# Patient Record
Sex: Female | Born: 1975 | Race: White | Hispanic: No | Smoking: Current every day smoker
Health system: Southern US, Community
[De-identification: ages and names within clinical notes are randomized; demographics above are authoritative.]

---

## 1998-05-01 ENCOUNTER — Emergency Department (HOSPITAL_COMMUNITY): Admission: EM | Admit: 1998-05-01 | Discharge: 1998-05-01 | Payer: Self-pay | Admitting: Emergency Medicine

## 1998-06-25 ENCOUNTER — Emergency Department (HOSPITAL_COMMUNITY): Admission: EM | Admit: 1998-06-25 | Discharge: 1998-06-25 | Payer: Self-pay | Admitting: Emergency Medicine

## 1998-06-25 ENCOUNTER — Encounter: Payer: Self-pay | Admitting: Emergency Medicine

## 2000-10-30 ENCOUNTER — Emergency Department (HOSPITAL_COMMUNITY): Admission: EM | Admit: 2000-10-30 | Discharge: 2000-10-30 | Payer: Self-pay | Admitting: Emergency Medicine

## 2002-10-27 ENCOUNTER — Emergency Department (HOSPITAL_COMMUNITY): Admission: EM | Admit: 2002-10-27 | Discharge: 2002-10-27 | Payer: Self-pay | Admitting: Emergency Medicine

## 2002-10-27 ENCOUNTER — Encounter: Payer: Self-pay | Admitting: Emergency Medicine

## 2006-06-06 ENCOUNTER — Emergency Department (HOSPITAL_COMMUNITY): Admission: EM | Admit: 2006-06-06 | Discharge: 2006-06-06 | Payer: Self-pay | Admitting: Emergency Medicine
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
## 2013-11-10 ENCOUNTER — Other Ambulatory Visit: Payer: Self-pay | Admitting: Family Medicine

## 2013-11-10 DIAGNOSIS — N63 Unspecified lump in unspecified breast: Secondary | ICD-10-CM

## 2013-11-24 ENCOUNTER — Ambulatory Visit
Admission: RE | Admit: 2013-11-24 | Discharge: 2013-11-24 | Disposition: A | Discharge status: Home / Self Care | Payer: BC Managed Care – PPO | Source: Ambulatory Visit | Attending: Family Medicine | Admitting: Family Medicine

## 2013-11-24 DIAGNOSIS — N63 Unspecified lump in unspecified breast: Secondary | ICD-10-CM

## 2022-03-12 ENCOUNTER — Telehealth: Payer: Self-pay | Admitting: Family Medicine

## 2022-03-12 NOTE — Telephone Encounter (Signed)
Pt called to schedule a physical. Pt states that she wants bloodwork done to make sure she is healthy and she wants birth control to regulate her cycle. Pt states that she believes she has started going through menopause. After explaining to the patient what our family planning clinic does and doesn't do, the patient said she still wants birth control. Stasia Cavalier recommended that I send a phone note so a nurse can call the patient.

## 2022-03-13 NOTE — Telephone Encounter (Signed)
Pt wants to have labs drawn to verify if she is getting "enough nutrition."  Pt state that she is having problems "keeping food down and with diarrhea."  Pt notified to contact [facility] or the [facility].

## 2022-03-18 ENCOUNTER — Emergency Department (HOSPITAL_COMMUNITY): Payer: Self-pay

## 2022-03-18 ENCOUNTER — Inpatient Hospital Stay (HOSPITAL_COMMUNITY)
Admission: EM | Admit: 2022-03-18 | Discharge: 2022-03-19 | DRG: 641 | Discharge status: Against Medical Advice | Payer: Self-pay | Attending: Internal Medicine | Admitting: Internal Medicine

## 2022-03-18 ENCOUNTER — Other Ambulatory Visit: Payer: Self-pay

## 2022-03-18 DIAGNOSIS — Z5329 Procedure and treatment not carried out because of patient's decision for other reasons: Secondary | ICD-10-CM | POA: Diagnosis present

## 2022-03-18 DIAGNOSIS — R9431 Abnormal electrocardiogram [ECG] [EKG]: Secondary | ICD-10-CM | POA: Diagnosis present

## 2022-03-18 DIAGNOSIS — R112 Nausea with vomiting, unspecified: Secondary | ICD-10-CM | POA: Diagnosis present

## 2022-03-18 DIAGNOSIS — F1290 Cannabis use, unspecified, uncomplicated: Secondary | ICD-10-CM | POA: Diagnosis present

## 2022-03-18 DIAGNOSIS — I4510 Unspecified right bundle-branch block: Secondary | ICD-10-CM | POA: Diagnosis present

## 2022-03-18 DIAGNOSIS — K921 Melena: Secondary | ICD-10-CM | POA: Diagnosis present

## 2022-03-18 DIAGNOSIS — R109 Unspecified abdominal pain: Secondary | ICD-10-CM | POA: Diagnosis present

## 2022-03-18 DIAGNOSIS — R197 Diarrhea, unspecified: Secondary | ICD-10-CM | POA: Diagnosis present

## 2022-03-18 DIAGNOSIS — R42 Dizziness and giddiness: Secondary | ICD-10-CM | POA: Diagnosis present

## 2022-03-18 DIAGNOSIS — E876 Hypokalemia: Principal | ICD-10-CM | POA: Diagnosis present

## 2022-03-18 LAB — CBC
HCT: 32.7 % — ABNORMAL LOW (ref 36.0–46.0)
Hemoglobin: 12.2 g/dL (ref 12.0–15.0)
MCH: 43.7 pg — ABNORMAL HIGH (ref 26.0–34.0)
MCHC: 37.3 g/dL — ABNORMAL HIGH (ref 30.0–36.0)
MCV: 117.2 fL — ABNORMAL HIGH (ref 80.0–100.0)
Platelets: 144 10*3/uL — ABNORMAL LOW (ref 150–400)
RBC: 2.79 MIL/uL — ABNORMAL LOW (ref 3.87–5.11)
RDW: 13 % (ref 11.5–15.5)
WBC: 5.6 10*3/uL (ref 4.0–10.5)
nRBC: 0 % (ref 0.0–0.2)

## 2022-03-18 LAB — COMPREHENSIVE METABOLIC PANEL
ALT: 72 U/L — ABNORMAL HIGH (ref 0–44)
AST: 147 U/L — ABNORMAL HIGH (ref 15–41)
Albumin: 3.7 g/dL (ref 3.5–5.0)
Alkaline Phosphatase: 93 U/L (ref 38–126)
Anion gap: 13 (ref 5–15)
BUN: <5 mg/dL — ABNORMAL LOW (ref 6–20)
CO2: 30 mmol/L (ref 22–32)
Calcium: 8.7 mg/dL — ABNORMAL LOW (ref 8.9–10.3)
Chloride: 94 mmol/L — ABNORMAL LOW (ref 98–111)
Creatinine, Ser: 0.71 mg/dL (ref 0.44–1.00)
GFR, Estimated: >60 mL/min (ref >60)
Glucose, Bld: 123 mg/dL — ABNORMAL HIGH (ref 70–99)
Potassium: 2.5 mmol/L — CL (ref 3.5–5.1)
Sodium: 137 mmol/L (ref 135–145)
Total Bilirubin: 1.3 mg/dL — ABNORMAL HIGH (ref 0.3–1.2)
Total Protein: 6.4 g/dL — ABNORMAL LOW (ref 6.5–8.1)

## 2022-03-18 LAB — I-STAT CHEM 8, ED
BUN: <3 mg/dL — ABNORMAL LOW (ref 6–20)
Calcium, Ion: 1.01 mmol/L — ABNORMAL LOW (ref 1.15–1.40)
Chloride: 92 mmol/L — ABNORMAL LOW (ref 98–111)
Creatinine, Ser: 0.6 mg/dL (ref 0.44–1.00)
Glucose, Bld: 116 mg/dL — ABNORMAL HIGH (ref 70–99)
HCT: 38 % (ref 36.0–46.0)
Hemoglobin: 12.9 g/dL (ref 12.0–15.0)
Potassium: 2.4 mmol/L — CL (ref 3.5–5.1)
Sodium: 137 mmol/L (ref 135–145)
TCO2: 30 mmol/L (ref 22–32)

## 2022-03-18 LAB — URINALYSIS, ROUTINE W REFLEX MICROSCOPIC
Bilirubin Urine: NEGATIVE
Glucose, UA: NEGATIVE mg/dL
Ketones, ur: NEGATIVE mg/dL
Leukocytes,Ua: NEGATIVE
Nitrite: NEGATIVE
Protein, ur: 100 mg/dL — AB
Specific Gravity, Urine: 1.021 (ref 1.005–1.030)
pH: 5 (ref 5.0–8.0)

## 2022-03-18 LAB — I-STAT BETA HCG BLOOD, ED (MC, WL, AP ONLY): I-stat hCG, quantitative: <5 m[IU]/mL (ref <5)

## 2022-03-18 LAB — LIPASE, BLOOD: Lipase: 42 U/L (ref 11–51)

## 2022-03-18 LAB — MAGNESIUM: Magnesium: 1.3 mg/dL — ABNORMAL LOW (ref 1.7–2.4)

## 2022-03-18 MED ORDER — POTASSIUM CHLORIDE CRYS ER 20 MEQ PO TBCR
40.0000 meq | EXTENDED_RELEASE_TABLET | Freq: Once | ORAL | Status: AC
  Administered 2022-03-18: 40 meq via ORAL
  Filled 2022-03-18: qty 2

## 2022-03-18 MED ORDER — LACTATED RINGERS IV BOLUS
2000.0000 mL | Freq: Once | INTRAVENOUS | Status: AC
  Administered 2022-03-18: 2000 mL via INTRAVENOUS

## 2022-03-18 MED ORDER — IOHEXOL 350 MG/ML SOLN
100.0000 mL | Freq: Once | INTRAVENOUS | Status: AC | PRN
  Administered 2022-03-18: 100 mL via INTRAVENOUS

## 2022-03-18 MED ORDER — POTASSIUM CHLORIDE 10 MEQ/100ML IV SOLN
10.0000 meq | INTRAVENOUS | Status: AC
  Administered 2022-03-18 (×4): 10 meq via INTRAVENOUS
  Filled 2022-03-18 (×4): qty 100

## 2022-03-18 MED ORDER — MAGNESIUM SULFATE 2 GM/50ML IV SOLN
2.0000 g | Freq: Once | INTRAVENOUS | Status: AC
  Administered 2022-03-18: 2 g via INTRAVENOUS
  Filled 2022-03-18: qty 50

## 2022-03-18 MED ORDER — HYDROMORPHONE HCL 1 MG/ML IJ SOLN
1.0000 mg | Freq: Once | INTRAMUSCULAR | Status: AC
  Administered 2022-03-18: 1 mg via INTRAVENOUS
  Filled 2022-03-18: qty 1

## 2022-03-18 MED ORDER — HEPARIN SODIUM (PORCINE) 5000 UNIT/ML IJ SOLN: 5000.0000 [IU] | Freq: Three times a day (TID) | INTRAMUSCULAR | Status: DC

## 2022-03-18 MED ORDER — SODIUM CHLORIDE 0.9 % IV SOLN
INTRAVENOUS | Status: DC
Start: 2022-03-18 — End: 2022-03-19

## 2022-03-18 MED ORDER — ONDANSETRON HCL 4 MG/2ML IJ SOLN
4.0000 mg | Freq: Once | INTRAMUSCULAR | Status: AC
  Administered 2022-03-18: 4 mg via INTRAVENOUS
  Filled 2022-03-18: qty 2

## 2022-03-18 MED ORDER — HYDROMORPHONE HCL 1 MG/ML IJ SOLN: 0.5000 mg | INTRAMUSCULAR | Status: DC | PRN

## 2022-03-18 MED ORDER — ALBUTEROL SULFATE (2.5 MG/3ML) 0.083% IN NEBU: 2.5000 mg | INHALATION_SOLUTION | RESPIRATORY_TRACT | Status: DC | PRN

## 2022-03-18 MED ORDER — ONDANSETRON HCL 4 MG PO TABS: 4.0000 mg | ORAL_TABLET | Freq: Four times a day (QID) | ORAL | Status: DC | PRN

## 2022-03-18 MED ORDER — ONDANSETRON HCL 4 MG/2ML IJ SOLN: 4.0000 mg | Freq: Four times a day (QID) | INTRAMUSCULAR | Status: DC | PRN

## 2022-03-18 NOTE — H&P (Incomplete)
History and Physical    Holly Ingram BSJ:[id number] DOB: [birth year] DOA: 03/18/2022  PCP: Patient, No Pcp Per  Patient coming from:   I have personally briefly reviewed patient's old medical records in [facility] Link  Chief Complaint:  abdominal pain diarrhea  n/v    HPI: Holly Ingram is a 46 y.o. female with medical history significant of chronic diarrhea x 2 years with intermittent flare last severe flare 7/21 at which time patient was seen in ED and [facility]. AT that time patient was not admitted , work did not nonspecific chronic inflammatory changes. Patient states she . She not presents to ed with flare of symptoms over the last 2-3 days. Patient also noted over the last few days she has not being able to tolearate. Patient also endorse marijuana use and states that using marijuana helps her symptoms.   ED Course:  Patient on evaluation noted to have persistent n/v  Labs notable for hypokalemia at  2.5 with ekg noting u waves , patient also noted to have hypomagnesemia at 1.3.  Patient is admitted for treatment of refractory n/v and electrolyte abnormalities.   Vitals Afeb, bp 170/112, hr 93, rr 17  sat 99% on ra   Labs: Wbc 5.6, hgb 12.2, mcv 117.2 plt 144 Lipase 43,  NA 137, K 2.5, gly 123, ast 147, alt72, t bili 1.3 Ua-poor specimen repeat pending Tx dilaudid , zofran,lr 2L,potasium 10 meqx4 Mag 2gram x1  Review of Systems: As per HPI otherwise 10 point review of systems negative.   No past medical history on file.  *** The histories are not reviewed yet. Please review them in the "History" navigator section and refresh this SmartLink.   has no history on file for tobacco use, alcohol use, and drug use.  Allergies  Allergen Reactions  . Morphine     No family history on file. *** Prior to Admission medications   Not on File    Physical Exam: Vitals:   03/18/22 1815 03/18/22 1830 03/18/22 1935 03/18/22 2030  BP: (!) 117/96 (!) 147/100  (!) 158/90  Pulse:  65   85  Resp: 18 (!) 22  14  Temp:   98.4 F (36.9 C)   TempSrc:   Oral   SpO2:  98%  100%    Constitutional: NAD, calm, comfortable Vitals:   03/18/22 1815 03/18/22 1830 03/18/22 1935 03/18/22 2030  BP: (!) 117/96 (!) 147/100  (!) 158/90  Pulse:  65  85  Resp: 18 (!) 22  14  Temp:   98.4 F (36.9 C)   TempSrc:   Oral   SpO2:  98%  100%   Constitutional: NAD, calm, comfortable Eyes: PERRL, lids and conjunctivae normal ENMT: Mucous membranes are moist. Posterior pharynx clear of any exudate or lesions.Normal dentition.  Neck: normal, supple, no masses, no thyromegaly Respiratory: clear to auscultation bilaterally, no wheezing, no crackles. Normal respiratory effort. No accessory muscle use.  Cardiovascular: Regular rate and rhythm, no murmurs / rubs / gallops. No extremity edema. 2+ pedal pulses. No carotid bruits.  Abdomen: no tenderness, no masses palpated. No hepatosplenomegaly. Bowel sounds positive.  Musculoskeletal: no clubbing / cyanosis. No joint deformity upper and lower extremities. Good ROM, no contractures. Normal muscle tone.  Skin: no rashes, lesions, ulcers. No induration Neurologic: CN 2-12 grossly intact. Sensation intact, DTR normal. Strength 5/5 in all 4.  Psychiatric: Normal judgment and insight. Alert and oriented x 3. Normal mood.    Labs on Admission: I have personally  reviewed following labs and imaging studies  CBC: Recent Labs  Lab 03/18/22 1519 03/18/22 1552  WBC 5.6  --   HGB 12.2 12.9  HCT 32.7* 38.0  MCV 117.2*  --   PLT 144*  --    Basic Metabolic Panel: Recent Labs  Lab 03/18/22 1519 03/18/22 1552  NA 137 137  K 2.5* 2.4*  CL 94* 92*  CO2 30  --   GLUCOSE 123* 116*  BUN <5* <3*  CREATININE 0.71 0.60  CALCIUM 8.7*  --   MG 1.3*  --    GFR: CrCl cannot be calculated (Unknown ideal weight.). Liver Function Tests: Recent Labs  Lab 03/18/22 1519  AST 147*  ALT 72*  ALKPHOS 93  BILITOT 1.3*  PROT 6.4*  ALBUMIN 3.7   Recent  Labs  Lab 03/18/22 1519  LIPASE 42   No results for input(s): "AMMONIA" in the last 168 hours. Coagulation Profile: No results for input(s): "INR", "PROTIME" in the last 168 hours. Cardiac Enzymes: No results for input(s): "CKTOTAL", "CKMB", "CKMBINDEX", "TROPONINI" in the last 168 hours. BNP (last 3 results) No results for input(s): "PROBNP" in the last 8760 hours. HbA1C: No results for input(s): "HGBA1C" in the last 72 hours. CBG: No results for input(s): "GLUCAP" in the last 168 hours. Lipid Profile: No results for input(s): "CHOL", "HDL", "LDLCALC", "TRIG", "CHOLHDL", "LDLDIRECT" in the last 72 hours. Thyroid Function Tests: No results for input(s): "TSH", "T4TOTAL", "FREET4", "T3FREE", "THYROIDAB" in the last 72 hours. Anemia Panel: No results for input(s): "VITAMINB12", "FOLATE", "FERRITIN", "TIBC", "IRON", "RETICCTPCT" in the last 72 hours. Urine analysis:    Component Value Date/Time   COLORURINE AMBER (A) 03/18/2022 1521   APPEARANCEUR HAZY (A) 03/18/2022 1521   LABSPEC 1.021 03/18/2022 1521   PHURINE 5.0 03/18/2022 1521   GLUCOSEU NEGATIVE 03/18/2022 1521   HGBUR MODERATE (A) 03/18/2022 1521   BILIRUBINUR NEGATIVE 03/18/2022 1521   KETONESUR NEGATIVE 03/18/2022 1521   PROTEINUR 100 (A) 03/18/2022 1521   NITRITE NEGATIVE 03/18/2022 1521   LEUKOCYTESUR NEGATIVE 03/18/2022 1521    Radiological Exams on Admission: CT ABDOMEN PELVIS W CONTRAST  Result Date: 03/18/2022 CLINICAL DATA:  Abdominal pain. EXAM: CT ABDOMEN AND PELVIS WITH CONTRAST TECHNIQUE: Multidetector CT imaging of the abdomen and pelvis was performed using the standard protocol following bolus administration of intravenous contrast. RADIATION DOSE REDUCTION: This exam was performed according to the departmental dose-optimization program which includes automated exposure control, adjustment of the mA and/or kV according to patient size and/or use of iterative reconstruction technique. CONTRAST:  165mL  OMNIPAQUE IOHEXOL 350 MG/ML SOLN COMPARISON:  None Available. FINDINGS: Lower chest: No acute abnormality. Hepatobiliary: Hepatic steatosis.  Normal gallbladder. Pancreas: Unremarkable. No pancreatic ductal dilatation or surrounding inflammatory changes. Spleen: Normal in size without focal abnormality. Adrenals/Urinary Tract: Adrenal glands are unremarkable. Kidneys are normal, without renal calculi, focal lesion, or hydronephrosis. Bladder is unremarkable. Stomach/Bowel: Stomach is within normal limits. Appendix appears normal. No evidence of bowel wall thickening, distention, or inflammatory changes. Vascular/Lymphatic: No significant vascular findings are present. No enlarged abdominal or pelvic lymph nodes. Reproductive: Normal appearance of the uterus. Small partially calcified mass abuts the uterine fundus, measuring 1.3 cm. This may represent an exophytic fibroid. 4.1 cm right adnexal cyst. Other: No abdominal wall hernia or abnormality. No abdominopelvic ascites. Musculoskeletal: Reverse S shaped scoliosis of the thoracolumbar spine with associated osteoarthritic changes. IMPRESSION: 1. Hepatic steatosis. 2. 4.1 cm right adnexal cyst. No follow-up imaging recommended. Note: This recommendation does not apply to  premenarchal patients and to those with increased risk (genetic, family history, elevated tumor markers or other high-risk factors) of ovarian cancer. Reference: JACR 2020 Feb; 17(2):248-254 3. Reverse S shaped scoliosis of the thoracolumbar spine with associated osteoarthritic changes. Electronically Signed   By: Ted Mcalpine M.D.   On: 03/18/2022 20:15    EKG: Independently reviewed. ***  Assessment/Plan Active Problems:   * No active hospital problems. *   Refractory n/v with abdominal pain  Insetting of chronic diarrhea x 2 years  As well as marijuana use  - supportive care  -to be complete sent stool studies  - will gi consult for c-scope r/o microscopic colitis  -check crp   -encourage cessation of marijuana   Hypomagnesemia  Hypokalemia -replete  -start oral once patient able to tolerate po   Abn lfts  -ast 147, alt 72    Mild thrombocytopenia   DVT prophylaxis   Code Status: *** (Full/Partial (specify details) Family Communication: *** (Specify name, relationship. Do not write "discussed with patient". Specify tel # if discussed over the phone) Disposition Plan: *** (specify when and where you expect patient to be discharged) Consults called: *** (with names) Admission status: *** (inpatient / obs / tele / medical floor / SDU)   Lurline Del MD [facility] Pager [phone]  If 7PM-7AM, please contact night-coverage [email or web address] Password [email or web address]  03/18/2022, 11:02 PM

## 2022-03-18 NOTE — ED Triage Notes (Signed)
Pt here from home for abd pain w/ N/V that pt states has been a recurrent problem for years. Pt reports being seen at [facility] and only being told her colon was inflamed. Pt reports she cannot eat without either throwing up or having diarrhea right after. Pt reports she is malnourished and feels like she is dying.

## 2022-03-18 NOTE — ED Provider Notes (Signed)
[facility] EMERGENCY DEPARTMENT Provider Note   CSN: [id number] Arrival date & time: 03/18/22  1422     History  Chief Complaint  Patient presents with   Emesis   Abdominal Pain   Diarrhea    Holly Ingram is a 46 y.o. female.  46 year old female with no significant past medical history presents today for evaluation of abdominal pain, nausea, vomiting, and diarrhea.  She states this has been ongoing now for about 2-1/2 years.  Initially this happened about twice a week but over the past year or so it has become daily.  She reports about 10-15 bowel movements which are loose and watery per day.  Reports occasional blood in her stool.  Reports worsening in nausea and vomiting as of late and not been able to keep any food or drink down.  States last p.o. intake she was able to keep them was yesterday.  Denies hematemesis.  States the abdominal pain has been consistent throughout her disease process however recently got worse.  Previously seen at [facility] but states has never been evaluated by gastroenterologist.  She endorses lightheadedness, but denies syncopal episodes.  She states couple months ago she became depressed because no one was listening to her regarding her illness and states for the past 2 weeks she has been taking her friend's antidepressant.  Currently denies thoughts of hurting herself.  On further questioning patient does endorse marijuana use almost daily.  States she started this following onset of her symptoms and she found that it helps.  Last use yesterday.  The history is provided by the patient. No language interpreter was used.       Home Medications Prior to Admission medications   Not on File      Allergies    Morphine    Review of Systems   Review of Systems  Constitutional:  Positive for fatigue. Negative for chills and fever.  Respiratory:  Negative for shortness of breath.   Cardiovascular:  Negative for chest pain.  Gastrointestinal:   Positive for abdominal pain, blood in stool, diarrhea, nausea and vomiting.  Neurological:  Positive for weakness and light-headedness. Negative for syncope.  All other systems reviewed and are negative.   Physical Exam Updated Vital Signs BP (!) 170/112 (BP Location: Right Arm)   Pulse 93   Temp 98.9 F (37.2 C) (Oral)   Resp 17   SpO2 99%  Physical Exam Vitals and nursing note reviewed.  Constitutional:      General: She is not in acute distress.    Appearance: Normal appearance. She is not ill-appearing.  HENT:     Head: Normocephalic and atraumatic.     Nose: Nose normal.  Eyes:     General: No scleral icterus.    Extraocular Movements: Extraocular movements intact.     Conjunctiva/sclera: Conjunctivae normal.  Cardiovascular:     Rate and Rhythm: Normal rate and regular rhythm.     Pulses: Normal pulses.     Heart sounds: Normal heart sounds.  Pulmonary:     Effort: Pulmonary effort is normal. No respiratory distress.     Breath sounds: Normal breath sounds. No wheezing or rales.  Abdominal:     General: There is no distension.     Tenderness: There is no abdominal tenderness.  Musculoskeletal:        General: Normal range of motion.     Cervical back: Normal range of motion.  Skin:    General: Skin is warm and  dry.  Neurological:     General: No focal deficit present.     Mental Status: She is alert. Mental status is at baseline.     ED Results / Procedures / Treatments   Labs (all labs ordered are listed, but only abnormal results are displayed) Labs Reviewed  CBC - Abnormal; Notable for the following components:      Result Value   RBC 2.79 (*)    HCT 32.7 (*)    MCV 117.2 (*)    MCH 43.7 (*)    MCHC 37.3 (*)    Platelets 144 (*)    All other components within normal limits  URINALYSIS, ROUTINE W REFLEX MICROSCOPIC - Abnormal; Notable for the following components:   Color, Urine AMBER (*)    APPearance HAZY (*)    Hgb urine dipstick MODERATE (*)     Protein, ur 100 (*)    Bacteria, UA RARE (*)    All other components within normal limits  I-STAT CHEM 8, ED - Abnormal; Notable for the following components:   Potassium 2.4 (*)    Chloride 92 (*)    BUN <3 (*)    Glucose, Bld 116 (*)    Calcium, Ion 1.01 (*)    All other components within normal limits  LIPASE, BLOOD  COMPREHENSIVE METABOLIC PANEL  I-STAT BETA HCG BLOOD, ED ([facility], [facility], [facility] ONLY)    EKG None  Radiology No results found.  Procedures .Critical Care  Performed by: Marita Kansas, PA-C Authorized by: Marita Kansas, PA-C   Critical care provider statement:    Critical care time (minutes):  30   Critical care was necessary to treat or prevent imminent or life-threatening deterioration of the following conditions:  Metabolic crisis   Critical care was time spent personally by me on the following activities:  Development of treatment plan with patient or surrogate, discussions with consultants, evaluation of patient's response to treatment, examination of patient, ordering and review of laboratory studies, ordering and review of radiographic studies, ordering and performing treatments and interventions, pulse oximetry, re-evaluation of patient's condition and review of old charts     Medications Ordered in ED Medications  potassium chloride 10 mEq in 100 mL IVPB (has no administration in time range)  lactated ringers bolus 2,000 mL (has no administration in time range)  HYDROmorphone (DILAUDID) injection 1 mg (has no administration in time range)  ondansetron (ZOFRAN) injection 4 mg (has no administration in time range)  potassium chloride SA (KLOR-CON M) CR tablet 40 mEq (has no administration in time range)    ED Course/ Medical Decision Making/ A&P Clinical Course as of 03/18/22 2045  Sun Mar 18, 2022  2045 CT abdomen pelvis without acute intra-abdominal process to explain patient's symptoms. [AA]    Clinical Course User Index [AA] Marita Kansas, PA-C                            Medical Decision Making Amount and/or Complexity of Data Reviewed Labs: ordered. Radiology: ordered.  Risk Prescription drug management. Decision regarding hospitalization.  Medical Decision Making / ED Course   This patient presents to the ED for concern of abdominal pain, nausea, vomiting, diarrhea, this involves an extensive number of treatment options, and is a complaint that carries with it a high risk of complications and morbidity.  The differential diagnosis includes gastroenteritis, diverticulitis, pancreatitis  MDM: 46 year old female with no significant past medical history presents today for above-mentioned  symptoms.  These have been longstanding however recently worsened.  Reports difficulty tolerating any p.o. intake as of late.  CBC shows no leukocytosis or anemia.  I-STAT Chem-8 showed hypokalemia with potassium 2.4.  CMP still pending.  IV potassium repletion as well as p.o. potassium repletion ordered.  EKG does show prolonged QT, as well as U waves.  CMP shows potassium of 2.5.  Renal function preserved.  Magnesium of 1.3.  2 g magnesium ordered.  CT abdomen pelvis pending.  Patient will likely require admission for cardiac monitoring and electrolyte repletion. CT abdomen pelvis without acute intra-abdominal findings.  Will discuss with hospitalist for admission. Discussed with hospitalist will evaluate patient for admission.   Lab Tests: -I ordered, reviewed, and interpreted labs.   The pertinent results include:   Labs Reviewed  COMPREHENSIVE METABOLIC PANEL - Abnormal; Notable for the following components:      Result Value   Potassium 2.5 (*)    Chloride 94 (*)    Glucose, Bld 123 (*)    BUN <5 (*)    Calcium 8.7 (*)    Total Protein 6.4 (*)    AST 147 (*)    ALT 72 (*)    Total Bilirubin 1.3 (*)    All other components within normal limits  CBC - Abnormal; Notable for the following components:   RBC 2.79 (*)    HCT 32.7 (*)    MCV 117.2 (*)     MCH 43.7 (*)    MCHC 37.3 (*)    Platelets 144 (*)    All other components within normal limits  URINALYSIS, ROUTINE W REFLEX MICROSCOPIC - Abnormal; Notable for the following components:   Color, Urine AMBER (*)    APPearance HAZY (*)    Hgb urine dipstick MODERATE (*)    Protein, ur 100 (*)    Bacteria, UA RARE (*)    All other components within normal limits  I-STAT CHEM 8, ED - Abnormal; Notable for the following components:   Potassium 2.4 (*)    Chloride 92 (*)    BUN <3 (*)    Glucose, Bld 116 (*)    Calcium, Ion 1.01 (*)    All other components within normal limits  GASTROINTESTINAL PANEL BY PCR, STOOL (REPLACES STOOL CULTURE)  LIPASE, BLOOD  MAGNESIUM  I-STAT BETA HCG BLOOD, ED ([facility], [facility], [facility] ONLY)      EKG  EKG Interpretation  Date/Time:  Sunday March 18 2022 17:05:25 EDT Ventricular Rate:  80 PR Interval:  123 QRS Duration: 137 QT Interval:  466 QTC Calculation: 489 R Axis:   52 Text Interpretation: Sinus rhythm Ventricular premature complex Right bundle branch block QTc 489 prolonged Confirmed by Vivien Rossetti ([id number]) on 03/18/2022 5:59:16 PM         Imaging Studies ordered: I ordered imaging studies including CT abdomen pelvis with contrast I independently visualized and interpreted imaging. I agree with the radiologist interpretation   Medicines ordered and prescription drug management: Meds ordered this encounter  Medications   potassium chloride 10 mEq in 100 mL IVPB   lactated ringers bolus 2,000 mL   HYDROmorphone (DILAUDID) injection 1 mg   ondansetron (ZOFRAN) injection 4 mg   potassium chloride SA (KLOR-CON M) CR tablet 40 mEq   magnesium sulfate IVPB 2 g 50 mL    -I have reviewed the patients home medicines and have made adjustments as needed  Critical interventions IV repletion of potassium for hypokalemia, IV fluids  Cardiac Monitoring: The  patient was maintained on a cardiac monitor.  I personally viewed and interpreted the  cardiac monitored which showed an underlying rhythm of: Normal sinus rhythm  Reevaluation: After the interventions noted above, I reevaluated the patient and found that they have :improved  Co morbidities that complicate the patient evaluation No past medical history on file.    Dispostion: Discussed with hospitalist will evaluate patient for admission.  Final Clinical Impression(s) / ED Diagnoses Final diagnoses:  None    Rx / DC Orders ED Discharge Orders     None         Marita Kansas, PA-C 03/18/22 2116    Mardene Sayer, MD 03/18/22 2148

## 2022-03-18 NOTE — ED Notes (Signed)
Main lab called to add on the [facility]

## 2022-03-19 LAB — COMPREHENSIVE METABOLIC PANEL
ALT: 77 U/L — ABNORMAL HIGH (ref 0–44)
AST: 165 U/L — ABNORMAL HIGH (ref 15–41)
Albumin: 3.5 g/dL (ref 3.5–5.0)
Alkaline Phosphatase: 92 U/L (ref 38–126)
Anion gap: 14 (ref 5–15)
BUN: <5 mg/dL — ABNORMAL LOW (ref 6–20)
CO2: 27 mmol/L (ref 22–32)
Calcium: 8.5 mg/dL — ABNORMAL LOW (ref 8.9–10.3)
Chloride: 93 mmol/L — ABNORMAL LOW (ref 98–111)
Creatinine, Ser: 0.96 mg/dL (ref 0.44–1.00)
GFR, Estimated: >60 mL/min (ref >60)
Glucose, Bld: 121 mg/dL — ABNORMAL HIGH (ref 70–99)
Potassium: 3 mmol/L — ABNORMAL LOW (ref 3.5–5.1)
Sodium: 134 mmol/L — ABNORMAL LOW (ref 135–145)
Total Bilirubin: 1.4 mg/dL — ABNORMAL HIGH (ref 0.3–1.2)
Total Protein: 6 g/dL — ABNORMAL LOW (ref 6.5–8.1)

## 2022-03-19 LAB — CBC
HCT: 30 % (ref 36.0–46.0)
Hemoglobin: 11 g/dL (ref 12.0–15.0)
MCH: 43.8 pg — ABNORMAL HIGH (ref 26.0–34.0)
MCHC: 36.7 g/dL — ABNORMAL HIGH (ref 30.0–36.0)
MCV: 119.5 fL — ABNORMAL HIGH (ref 80.0–100.0)
Platelets: 68 10*3/uL — ABNORMAL LOW (ref 150–400)
RBC: 2.51 MIL/uL (ref 3.87–5.11)
RDW: 58.3 % — ABNORMAL HIGH (ref 11.5–15.5)
WBC: 6.2 10*3/uL (ref 4.0–10.5)

## 2022-03-19 LAB — C-REACTIVE PROTEIN: CRP: 0.5 mg/dL (ref <1.0)

## 2022-03-19 LAB — HIV ANTIBODY (ROUTINE TESTING W REFLEX): HIV Screen 4th Generation wRfx: NONREACTIVE

## 2022-03-19 LAB — ETHANOL: Alcohol, Ethyl (B): <10 mg/dL (ref <10)

## 2022-03-19 MED ORDER — ALUM & MAG HYDROXIDE-SIMETH 200-200-20 MG/5ML PO SUSP
30.0000 mL | ORAL | Status: DC | PRN
  Administered 2022-03-19: 30 mL via ORAL
  Filled 2022-03-19: qty 30

## 2022-03-19 NOTE — ED Notes (Signed)
Pt came back in for belongings, where IV was removed and pt informed staff she was leaving. Pt informed of risks of leaving. Pt stable upon leaving the ED

## 2022-03-19 NOTE — ED Notes (Signed)
Patient left ama prior to being seen

## 2022-03-19 NOTE — ED Notes (Signed)
This RN informed pt that they aren't allowed to leave ED due to being safety issue and that if they left they would have to start process all over again. Both pt and visitor understood. This RN came back to pt not being able to be found. This pt asked registration if they seen pt leave, registration informed this RN that pt was seen leaving ED.

## 2022-03-20 ENCOUNTER — Emergency Department (HOSPITAL_COMMUNITY)
Admission: EM | Admit: 2022-03-20 | Discharge: 2022-03-21 | Discharge status: Against Medical Advice | Payer: Medicaid Other | Attending: Emergency Medicine | Admitting: Emergency Medicine

## 2022-03-20 DIAGNOSIS — R079 Chest pain, unspecified: Secondary | ICD-10-CM | POA: Insufficient documentation

## 2022-03-20 DIAGNOSIS — R112 Nausea with vomiting, unspecified: Secondary | ICD-10-CM | POA: Insufficient documentation

## 2022-03-20 DIAGNOSIS — Z5321 Procedure and treatment not carried out due to patient leaving prior to being seen by health care provider: Secondary | ICD-10-CM | POA: Insufficient documentation

## 2022-03-20 DIAGNOSIS — R109 Unspecified abdominal pain: Secondary | ICD-10-CM | POA: Insufficient documentation

## 2022-03-20 DIAGNOSIS — F17210 Nicotine dependence, cigarettes, uncomplicated: Secondary | ICD-10-CM | POA: Insufficient documentation

## 2022-03-20 LAB — TROPONIN I (HIGH SENSITIVITY)
Troponin I (High Sensitivity): 7 ng/L (ref <18)
Troponin I (High Sensitivity): 4 ng/L (ref <18)

## 2022-03-20 LAB — COMPREHENSIVE METABOLIC PANEL
ALT: 128 U/L — ABNORMAL HIGH (ref 0–44)
AST: 319 U/L — ABNORMAL HIGH (ref 15–41)
Albumin: 3.5 g/dL (ref 3.5–5.0)
Alkaline Phosphatase: 108 U/L (ref 38–126)
Anion gap: 9 (ref 5–15)
BUN: <5 mg/dL — ABNORMAL LOW (ref 6–20)
CO2: 33 mmol/L — ABNORMAL HIGH (ref 22–32)
Calcium: 8.7 mg/dL — ABNORMAL LOW (ref 8.9–10.3)
Chloride: 98 mmol/L (ref 98–111)
Creatinine, Ser: 0.77 mg/dL (ref 0.44–1.00)
GFR, Estimated: >60 mL/min (ref >60)
Glucose, Bld: 106 mg/dL — ABNORMAL HIGH (ref 70–99)
Potassium: 3.2 mmol/L — ABNORMAL LOW (ref 3.5–5.1)
Sodium: 140 mmol/L (ref 135–145)
Total Bilirubin: 1.4 mg/dL — ABNORMAL HIGH (ref 0.3–1.2)
Total Protein: 6.1 g/dL — ABNORMAL LOW (ref 6.5–8.1)

## 2022-03-20 LAB — CBC
HCT: 31.3 % — ABNORMAL LOW (ref 36.0–46.0)
Hemoglobin: 11.1 g/dL — ABNORMAL LOW (ref 12.0–15.0)
MCH: 43 pg — ABNORMAL HIGH (ref 26.0–34.0)
MCHC: 35.5 g/dL (ref 30.0–36.0)
MCV: 121.3 fL — ABNORMAL HIGH (ref 80.0–100.0)
Platelets: 149 10*3/uL — ABNORMAL LOW (ref 150–400)
RBC: 2.58 MIL/uL — ABNORMAL LOW (ref 3.87–5.11)
RDW: 13.4 % (ref 11.5–15.5)
WBC: 5.1 10*3/uL (ref 4.0–10.5)
nRBC: 0.4 % — ABNORMAL HIGH (ref 0.0–0.2)

## 2022-03-20 LAB — URINALYSIS, ROUTINE W REFLEX MICROSCOPIC
Glucose, UA: NEGATIVE mg/dL
Hgb urine dipstick: NEGATIVE
Ketones, ur: 5 mg/dL — AB
Nitrite: NEGATIVE
Protein, ur: 100 mg/dL — AB
Specific Gravity, Urine: 1.028 (ref 1.005–1.030)
pH: 6 (ref 5.0–8.0)

## 2022-03-20 LAB — LIPASE, BLOOD: Lipase: 44 U/L (ref 11–51)

## 2022-03-20 NOTE — ED Provider Triage Note (Signed)
Emergency Medicine Provider Triage Evaluation Note  Holly Ingram , a 46 y.o. female  was evaluated in triage.  Pt complains of ongoing abdominal pain, chest pain, nausea, vomiting since she left AMA from the hospital yesterday.  Patient was evaluated by hospitalist and was pending admission for cardiac monitoring and electrolyte repletion.  Patient states that she left to smoke a cigarette.  She is very apologetic and states that she will not do that again.  She is still feeling poorly and wants to "pick up where she left off".  Denies fever, chills.  Review of Systems  Positive:  Negative:   Physical Exam  BP (!) 154/89   Pulse 88   Temp 98.8 F (37.1 C) (Oral)   Resp 16   SpO2 99%  Gen:   Awake, no distress   Resp:  Normal effort  MSK:   Moves extremities without difficulty  Other:    Medical Decision Making  Medically screening exam initiated at 4:08 PM.  Appropriate orders placed.  Holly Ingram was informed that the remainder of the evaluation will be completed by another provider, this initial triage assessment does not replace that evaluation, and the importance of remaining in the ED until their evaluation is complete.     Janell Quiet, [place] 03/20/22 1610

## 2022-03-20 NOTE — ED Triage Notes (Signed)
Pt c/o abd pain N/V/D, poor PO, electrolyte imbalance, seen for same 8/6, left prior to care completion. Told she has inflamed colon, states she wants to stay for full tx "this time so I can feel better."

## 2022-03-21 NOTE — ED Notes (Signed)
Patient states the wait is too long and she is leaving 

## 2022-05-20 ENCOUNTER — Emergency Department (HOSPITAL_BASED_OUTPATIENT_CLINIC_OR_DEPARTMENT_OTHER): Payer: Self-pay

## 2022-05-20 ENCOUNTER — Encounter (HOSPITAL_COMMUNITY): Payer: Self-pay

## 2022-05-20 ENCOUNTER — Inpatient Hospital Stay (HOSPITAL_BASED_OUTPATIENT_CLINIC_OR_DEPARTMENT_OTHER)
Admission: EM | Admit: 2022-05-20 | Discharge: 2022-05-22 | DRG: 603 | Disposition: A | Discharge status: Home / Self Care | Payer: Self-pay | Attending: Internal Medicine | Admitting: Internal Medicine

## 2022-05-20 ENCOUNTER — Encounter (HOSPITAL_COMMUNITY): Payer: Self-pay | Admitting: Internal Medicine

## 2022-05-20 ENCOUNTER — Other Ambulatory Visit: Payer: Self-pay

## 2022-05-20 DIAGNOSIS — D539 Nutritional anemia, unspecified: Secondary | ICD-10-CM

## 2022-05-20 DIAGNOSIS — R7989 Other specified abnormal findings of blood chemistry: Secondary | ICD-10-CM | POA: Diagnosis present

## 2022-05-20 DIAGNOSIS — L03114 Cellulitis of left upper limb: Principal | ICD-10-CM | POA: Diagnosis present

## 2022-05-20 DIAGNOSIS — R197 Diarrhea, unspecified: Secondary | ICD-10-CM | POA: Diagnosis present

## 2022-05-20 DIAGNOSIS — Z23 Encounter for immunization: Secondary | ICD-10-CM

## 2022-05-20 DIAGNOSIS — R17 Unspecified jaundice: Secondary | ICD-10-CM | POA: Diagnosis present

## 2022-05-20 DIAGNOSIS — L0390 Cellulitis, unspecified: Secondary | ICD-10-CM | POA: Diagnosis present

## 2022-05-20 DIAGNOSIS — M419 Scoliosis, unspecified: Secondary | ICD-10-CM | POA: Diagnosis present

## 2022-05-20 DIAGNOSIS — E876 Hypokalemia: Secondary | ICD-10-CM | POA: Diagnosis present

## 2022-05-20 DIAGNOSIS — E872 Acidosis, unspecified: Secondary | ICD-10-CM | POA: Diagnosis present

## 2022-05-20 DIAGNOSIS — W5501XA Bitten by cat, initial encounter: Secondary | ICD-10-CM

## 2022-05-20 DIAGNOSIS — R7401 Elevation of levels of liver transaminase levels: Secondary | ICD-10-CM | POA: Diagnosis present

## 2022-05-20 DIAGNOSIS — S61452A Open bite of left hand, initial encounter: Secondary | ICD-10-CM | POA: Diagnosis present

## 2022-05-20 DIAGNOSIS — D519 Vitamin B12 deficiency anemia, unspecified: Secondary | ICD-10-CM | POA: Diagnosis present

## 2022-05-20 LAB — URINALYSIS, ROUTINE W REFLEX MICROSCOPIC
Glucose, UA: NEGATIVE mg/dL
Nitrite: NEGATIVE
Protein, ur: 100 mg/dL — AB
Specific Gravity, Urine: 1.025 (ref 1.005–1.030)
pH: 7.5 (ref 5.0–8.0)

## 2022-05-20 LAB — COMPREHENSIVE METABOLIC PANEL
ALT: 29 U/L (ref 0–44)
AST: 54 U/L — ABNORMAL HIGH (ref 15–41)
Albumin: 3.5 g/dL (ref 3.5–5.0)
Alkaline Phosphatase: 96 U/L (ref 38–126)
Anion gap: 13 (ref 5–15)
BUN: 6 mg/dL (ref 6–20)
CO2: 30 mmol/L (ref 22–32)
Calcium: 8.8 mg/dL — ABNORMAL LOW (ref 8.9–10.3)
Chloride: 99 mmol/L (ref 98–111)
Creatinine, Ser: 0.81 mg/dL (ref 0.44–1.00)
GFR, Estimated: >60 mL/min (ref >60)
Glucose, Bld: 131 mg/dL — ABNORMAL HIGH (ref 70–99)
Potassium: 2.9 mmol/L — ABNORMAL LOW (ref 3.5–5.1)
Sodium: 142 mmol/L (ref 135–145)
Total Bilirubin: 4.6 mg/dL — ABNORMAL HIGH (ref 0.3–1.2)
Total Protein: 6.4 g/dL — ABNORMAL LOW (ref 6.5–8.1)

## 2022-05-20 LAB — LACTIC ACID, PLASMA
Lactic Acid, Venous: 3.4 mmol/L (ref 0.5–1.9)
Lactic Acid, Venous: 1.6 mmol/L (ref 0.5–1.9)

## 2022-05-20 LAB — CBC WITH DIFFERENTIAL/PLATELET
Abs Immature Granulocytes: 0.08 10*3/uL — ABNORMAL HIGH (ref 0.00–0.07)
Basophils Absolute: 0 10*3/uL (ref 0.0–0.1)
Basophils Relative: 0 %
Eosinophils Absolute: 0 10*3/uL (ref 0.0–0.5)
Eosinophils Relative: 0 %
HCT: 27.9 % — ABNORMAL LOW (ref 36.0–46.0)
Hemoglobin: 10.1 g/dL — ABNORMAL LOW (ref 12.0–15.0)
Immature Granulocytes: 1 %
Lymphocytes Relative: 5 %
Lymphs Abs: 0.7 10*3/uL (ref 0.7–4.0)
MCH: 42.8 pg — ABNORMAL HIGH (ref 26.0–34.0)
MCHC: 36.2 g/dL — ABNORMAL HIGH (ref 30.0–36.0)
MCV: 118.2 fL — ABNORMAL HIGH (ref 80.0–100.0)
Monocytes Absolute: 0.5 10*3/uL (ref 0.1–1.0)
Monocytes Relative: 4 %
Neutro Abs: 13.2 10*3/uL — ABNORMAL HIGH (ref 1.7–7.7)
Neutrophils Relative %: 90 %
Platelets: 163 10*3/uL (ref 150–400)
RBC: 2.36 MIL/uL — ABNORMAL LOW (ref 3.87–5.11)
RDW: 17.8 % — ABNORMAL HIGH (ref 11.5–15.5)
WBC: 14.5 10*3/uL — ABNORMAL HIGH (ref 4.0–10.5)
nRBC: 0 % (ref 0.0–0.2)

## 2022-05-20 LAB — PREGNANCY, URINE: Preg Test, Ur: NEGATIVE

## 2022-05-20 MED ORDER — ONDANSETRON HCL 4 MG/2ML IJ SOLN
4.0000 mg | Freq: Once | INTRAMUSCULAR | Status: AC
  Administered 2022-05-20: 4 mg via INTRAVENOUS
  Filled 2022-05-20: qty 2

## 2022-05-20 MED ORDER — POTASSIUM CHLORIDE 10 MEQ/100ML IV SOLN
10.0000 meq | INTRAVENOUS | Status: AC
  Administered 2022-05-20 (×2): 10 meq via INTRAVENOUS
  Filled 2022-05-20 (×2): qty 100

## 2022-05-20 MED ORDER — ENOXAPARIN SODIUM 40 MG/0.4ML IJ SOSY
40.0000 mg | PREFILLED_SYRINGE | INTRAMUSCULAR | Status: DC
  Administered 2022-05-20 – 2022-05-21 (×2): 40 mg via SUBCUTANEOUS
  Filled 2022-05-20 (×2): qty 0.4

## 2022-05-20 MED ORDER — CALCIUM CARBONATE 1250 (500 CA) MG PO TABS
1.0000 | ORAL_TABLET | Freq: Once | ORAL | Status: AC
  Administered 2022-05-20: 1250 mg via ORAL
  Filled 2022-05-20: qty 1

## 2022-05-20 MED ORDER — ONDANSETRON HCL 4 MG/2ML IJ SOLN: 4.0000 mg | Freq: Four times a day (QID) | INTRAMUSCULAR | Status: DC | PRN

## 2022-05-20 MED ORDER — NICOTINE 14 MG/24HR TD PT24
14.0000 mg | MEDICATED_PATCH | Freq: Every day | TRANSDERMAL | Status: AC
  Administered 2022-05-20 – 2022-05-21 (×2): 14 mg via TRANSDERMAL
  Filled 2022-05-20 (×2): qty 1

## 2022-05-20 MED ORDER — VANCOMYCIN HCL IN DEXTROSE 1-5 GM/200ML-% IV SOLN
1000.0000 mg | Freq: Once | INTRAVENOUS | Status: AC
  Administered 2022-05-20: 1000 mg via INTRAVENOUS
  Filled 2022-05-20: qty 200

## 2022-05-20 MED ORDER — FENTANYL CITRATE PF 50 MCG/ML IJ SOSY
12.5000 ug | PREFILLED_SYRINGE | INTRAMUSCULAR | Status: DC | PRN
  Administered 2022-05-20: 12.5 ug via INTRAVENOUS
  Filled 2022-05-20: qty 1

## 2022-05-20 MED ORDER — HYDROCODONE-ACETAMINOPHEN 5-325 MG PO TABS
1.0000 | ORAL_TABLET | ORAL | Status: DC | PRN
  Administered 2022-05-20: 1 via ORAL
  Filled 2022-05-20: qty 1

## 2022-05-20 MED ORDER — SODIUM CHLORIDE 0.9 % IV BOLUS
1000.0000 mL | Freq: Once | INTRAVENOUS | Status: AC
  Administered 2022-05-20: 1000 mL via INTRAVENOUS

## 2022-05-20 MED ORDER — FENTANYL CITRATE PF 50 MCG/ML IJ SOSY
50.0000 ug | PREFILLED_SYRINGE | Freq: Once | INTRAMUSCULAR | Status: AC
  Administered 2022-05-20: 50 ug via INTRAVENOUS
  Filled 2022-05-20: qty 1

## 2022-05-20 MED ORDER — SODIUM CHLORIDE 0.9 % IV SOLN: INTRAVENOUS | Status: AC

## 2022-05-20 MED ORDER — TETANUS-DIPHTH-ACELL PERTUSSIS 5-2.5-18.5 LF-MCG/0.5 IM SUSY
0.5000 mL | PREFILLED_SYRINGE | Freq: Once | INTRAMUSCULAR | Status: AC
  Administered 2022-05-20: 0.5 mL via INTRAMUSCULAR
  Filled 2022-05-20: qty 0.5

## 2022-05-20 MED ORDER — SODIUM CHLORIDE 0.9 % IV SOLN
3.0000 g | Freq: Four times a day (QID) | INTRAVENOUS | Status: DC
  Administered 2022-05-20 – 2022-05-22 (×7): 3 g via INTRAVENOUS
  Filled 2022-05-20 (×8): qty 8

## 2022-05-20 MED ORDER — HYDROCODONE-ACETAMINOPHEN 5-325 MG PO TABS
1.0000 | ORAL_TABLET | ORAL | Status: DC | PRN
  Administered 2022-05-21 (×2): 2 via ORAL
  Administered 2022-05-21: 1 via ORAL
  Administered 2022-05-21 – 2022-05-22 (×5): 2 via ORAL
  Filled 2022-05-20 (×5): qty 2
  Filled 2022-05-20: qty 1
  Filled 2022-05-20 (×2): qty 2

## 2022-05-20 NOTE — Assessment & Plan Note (Signed)
Macrocytic anemia. -Patient has premenopausal symptoms.  Has not had a menstrual cycle for at least a month and a half.  However she has been dealing with GI issues for least 2 years with persistent diarrhea which is worsened recently.  Suspect she could have issues with malnutrition. -Obtain vitamin B12, folate and iron panel -Total bili is also elevated at 4.6 up from 1.4.  Have low suspicion that it is hemolytic but will obtain LDH and haptoglobin.

## 2022-05-20 NOTE — Assessment & Plan Note (Signed)
K 2.9. Replete with IV K 38meq x2

## 2022-05-20 NOTE — Assessment & Plan Note (Addendum)
Secondary to cat bite on around dorsal 4th MCP of left hand -Given IV vancomycin in ED. Will change to IV Unasyn  -pt still has good ROM, no fluctuance or crepitus on exam.  EDP discussed with hand surgery Dr. Marcelino Scot and no urgent intervention or wash out recommended at this point -Tetanus vaccine given

## 2022-05-20 NOTE — Progress Notes (Signed)
Pharmacy Antibiotic Note  Holly Ingram is a 46 y.o. female admitted on 05/20/2022 with cat bite wound infection.  Pharmacy has been consulted for unasyn dosing.  Plan: Unasyn 3 mg IV q6h   Height: 5\' 2"  (157.5 cm) Weight: 56.2 kg (124 lb) IBW/kg (Calculated) : 50.1  Temp (24hrs), Avg:99.3 F (37.4 C), Min:99.2 F (37.3 C), Max:99.3 F (37.4 C)  Recent Labs  Lab 05/20/22 1421 05/20/22 1839  WBC 14.5*  --   CREATININE 0.81  --   LATICACIDVEN 3.4* 1.6    Estimated Creatinine Clearance: 68.6 mL/min (by C-G formula based on SCr of 0.81 mg/dL).    Allergies  Allergen Reactions   Morphine     Antimicrobials this admission: 10/8 vanc x 1 10/8 Unasyn>>  Dose adjustments this admission:  Microbiology results: 10/8 BCx2: sent  Thank you for allowing pharmacy to be a part of this patient's care.  Eudelia Bunch, Pharm.D 05/20/2022 7:35 PM

## 2022-05-20 NOTE — ED Triage Notes (Signed)
Patient arrives with complaints of sustaining a cat bite 2 days. Per the patient, the cat bit hit her left hand. The patient cleaned the site, but her hand has become swollen/red/hot to touch.  The cat was a house cat that was up to do date on her shots. Patient has also developed nausea/vomiting over the past 2 days as well.   Rates pain a 10/10.

## 2022-05-20 NOTE — Assessment & Plan Note (Signed)
Patient has history of chronically elevated LFTs and elevated total bilirubin.  This was acutely worse today at 4.6 with no significant GI symptoms. -She has been worked up in the past at [facility] in 2021 with negative hepatitis B, C, HIV, TSH,IgG, CRP, ESR -Has GI appt scheduled outpatient -LDH and haptoglobin labs pending due to concomitant anemia

## 2022-05-20 NOTE — Assessment & Plan Note (Signed)
Mild.  Oral calcium supplementation

## 2022-05-20 NOTE — ED Notes (Signed)
Report called to floor

## 2022-05-20 NOTE — ED Provider Notes (Signed)
Emergency Department Provider Note   I have reviewed the triage vital signs and the nursing notes.   HISTORY  Chief Complaint Animal Bite and Emesis   HPI Holly Ingram is a 46 y.o. female presents the emergency department for evaluation of pain, redness, swelling to the left hand spreading up the left arm after cat bite.  The bite occurred 2 days prior to ED arrival.  She is been having fevers, aches, vomiting, severe fatigue and shaking.  She has noticed fairly rapid swelling and redness up the left arm.  She was bitten by a friend's cat, who is up-to-date on rabies.  The cat bit her single time on the dorsum of the left hand just adjacent to the fourth/fifth MCP.  She denies any other injuries.  No past medical history on file.  Review of Systems  Constitutional: Positive fever/chills Cardiovascular: Denies chest pain. Respiratory: Denies shortness of breath. Gastrointestinal: No abdominal pain.   Musculoskeletal: Negative for back pain. Skin: Left hand swelling/redness to the arm.  Neurological: Negative for headaches, focal weakness or numbness.  ____________________________________________   PHYSICAL EXAM:  VITAL SIGNS: ED Triage Vitals  Enc Vitals Group     BP 05/20/22 1412 135/81     Pulse Rate 05/20/22 1412 85     Resp 05/20/22 1412 18     Temp 05/20/22 1412 99.3 F (37.4 C)     Temp Source 05/20/22 1412 Oral     SpO2 05/20/22 1412 97 %     Weight 05/20/22 1410 124 lb (56.2 kg)     Height 05/20/22 1410 5\' 2"  (1.575 m)   Constitutional: Alert and oriented. Well appearing and in no acute distress. Eyes: Conjunctivae are normal.  Head: Atraumatic. Nose: No congestion/rhinnorhea. Mouth/Throat: Mucous membranes are moist.   Neck: No stridor.  Cardiovascular: Normal rate, regular rhythm. Good peripheral circulation. Grossly normal heart sounds.   Respiratory: Normal respiratory effort.  No retractions. Lungs CTAB. Gastrointestinal: Soft and nontender. No  distention.  Musculoskeletal: No lower extremity tenderness nor edema. No gross deformities of extremities. Neurologic:  Normal speech and language. No gross focal neurologic deficits are appreciated.  Skin:  Skin is warm and dry.  Erythema to the dorsum of the left hand with 2 puncture marks at the base of the fourth/fifth MCP joints on the left.  There is diffuse swelling of the left hand and distal left forearm.  Some erythema tracking medially to the upper arm.  No area of fluctuance or skin breakdown/ulceration.       ____________________________________________   LABS (all labs ordered are listed, but only abnormal results are displayed)  Labs Reviewed  LACTIC ACID, PLASMA - Abnormal; Notable for the following components:      Result Value   Lactic Acid, Venous 3.4 (*)    All other components within normal limits  COMPREHENSIVE METABOLIC PANEL - Abnormal; Notable for the following components:   Potassium 2.9 (*)    Glucose, Bld 131 (*)    Calcium 8.8 (*)    Total Protein 6.4 (*)    AST 54 (*)    Total Bilirubin 4.6 (*)    All other components within normal limits  CBC WITH DIFFERENTIAL/PLATELET - Abnormal; Notable for the following components:   WBC 14.5 (*)    RBC 2.36 (*)    Hemoglobin 10.1 (*)    HCT 27.9 (*)    MCV 118.2 (*)    MCH 42.8 (*)    MCHC 36.2 (*)  RDW 17.8 (*)    Neutro Abs 13.2 (*)    Abs Immature Granulocytes 0.08 (*)    All other components within normal limits  URINALYSIS, ROUTINE W REFLEX MICROSCOPIC - Abnormal; Notable for the following components:   Color, Urine ORANGE (*)    APPearance HAZY (*)    Hgb urine dipstick TRACE (*)    Bilirubin Urine SMALL (*)    Ketones, ur TRACE (*)    Protein, ur 100 (*)    Leukocytes,Ua TRACE (*)    Bacteria, UA RARE (*)    All other components within normal limits  CULTURE, BLOOD (ROUTINE X 2)  CULTURE, BLOOD (ROUTINE X 2)  PREGNANCY, URINE  LACTIC ACID, PLASMA    ____________________________________________  RADIOLOGY  DG Hand Complete Left  Result Date: 05/20/2022 CLINICAL DATA:  Cat bite EXAM: LEFT HAND - COMPLETE 3 VIEW COMPARISON:  None Available. FINDINGS: There is no evidence of fracture or dislocation. There is no evidence of arthropathy or other focal bone abnormality. Moderate palmar soft tissue swelling. No radiopaque foreign body. IMPRESSION: No acute fracture or radiopaque foreign body. Electronically Signed   By: Jacob Moores M.D.   On: 05/20/2022 15:38    ____________________________________________   PROCEDURES  Procedure(s) performed:   Procedures  CRITICAL CARE Performed by: Maia Plan Total critical care time: 35 minutes Critical care time was exclusive of separately billable procedures and treating other patients. Critical care was necessary to treat or prevent imminent or life-threatening deterioration. Critical care was time spent personally by me on the following activities: development of treatment plan with patient and/or surrogate as well as nursing, discussions with consultants, evaluation of patient's response to treatment, examination of patient, obtaining history from patient or surrogate, ordering and performing treatments and interventions, ordering and review of laboratory studies, ordering and review of radiographic studies, pulse oximetry and re-evaluation of patient's condition.  Alona Bene, MD Emergency Medicine  ____________________________________________   INITIAL IMPRESSION / ASSESSMENT AND PLAN / ED COURSE  Pertinent labs & imaging results that were available during my care of the patient were reviewed by me and considered in my medical decision making (see chart for details).   This patient is Presenting for Evaluation of hand pain/swelling, which does require a range of treatment options, and is a complaint that involves a high risk of morbidity and mortality.  The Differential  Diagnoses include cellulitis, developing abscess, fasciitis, flexor tenosynovitis, etc.   I did obtain Additional Historical Information from friend at bedside.   Clinical Laboratory Tests Ordered, included lactic acid of 3.4 with white count of 14.5.  Mild anemia to 10.1.  No acute kidney injury.  Potassium of 2.9.  Radiologic Tests Ordered, included hand x-ray. I independently interpreted the images and agree with radiology interpretation.   Cardiac Monitor Tracing which shows NSR.   Consult complete with Hand Surgery on call, Dr. Carola Frost. Plan for [facility] admit to [facility] for abx and monitoring. Hand surgery is available at that location shoulder symptoms worsen but no indication for emergent/urgent washout at this time.   Hospitalist with plan for admit.   Medical Decision Making: Summary:  Presents to the emergency department for evaluation of left arm cellulitis after cat bite.  I do not appreciate any clear superficial abscess at the site of the bite.  Plan to coordinate with hand surgery but anticipate admit for IV antibiotics and monitoring.   Reevaluation with update and discussion with patient. Plan for admit to [facility]. She is in agreement.  Disposition: admit  ____________________________________________  FINAL CLINICAL IMPRESSION(S) / ED DIAGNOSES  Final diagnoses:  Cellulitis of hand, left  Cat bite, initial encounter  Lactic acidosis     Note:  This document was prepared using Dragon voice recognition software and may include unintentional dictation errors.  Alona Bene, MD, [facility] Emergency Medicine    Aniya Jolicoeur, Arlyss Repress, MD 05/20/22 [phone]

## 2022-05-20 NOTE — H&P (Signed)
History and Physical    Patient: Holly Ingram LOV:[id number] DOB: 11-08-1975 DOA: 05/20/2022 DOS: the patient was seen and examined on 05/20/2022 PCP: Patient, No Pcp Per  Patient coming from:  [place] ED  Chief Complaint:  Chief Complaint  Patient presents with   Animal Bite   Emesis   HPI: Yoshino Broccoli is a 46 y.o. female with medical history significant of scoliosis who presents with cat bite and worsening erythema edema.  Patient was playing with a friend's cat 2 days ago and was bit around her left dorsal hand around fourth MCP.  Cat up-to-date with vaccination.  Initially had swelling up to her wrist.  The following day woke up with worsening tightness of her edema and increased difficulty in flexing her fingers and holding a fist.  In the next few hours redness progressively spread all the way to her upper forearm.  Had increasing pain.  She also had nausea but no vomiting.  In the ED, she was afebrile, normotensive on room air.   WBC of 14.5, hemoglobin of 10.1 with MCV of 118.  Lactate of 3.4.  Potassium of 2.9.  Creatinine 0.81.  Mildly elevated AST 54, ALT 20, total bilirubin 4.6 which has upward trended from 1.4 from just 2 months ago.   Review of Systems: As mentioned in the history of present illness. All other systems reviewed and are negative.  Social History: Has tobacco, marijuana and occasional alcohol use. Allergies  Allergen Reactions   Morphine     History reviewed. No pertinent family history.  Prior to Admission medications   Not on File    Physical Exam: Vitals:   05/20/22 1412 05/20/22 1413 05/20/22 1727 05/20/22 1821  BP: 135/81  (!) 120/97 (!) 149/93  Pulse: 85  86 85  Resp: 18  18 18   Temp: 99.3 F (37.4 C)  99.2 F (37.3 C) 99.3 F (37.4 C)  TempSrc: Oral  Oral Oral  SpO2: 97% 97% 100% 99%  Weight:      Height:       Constitutional: NAD, calm, comfortable, nontoxic appearing middle-age female lying in bed Eyes:  lids and  conjunctivae normal ENMT: Mucous membranes are moist. Neck: normal, supple Respiratory: clear to auscultation bilaterally, no wheezing, no crackles. Normal respiratory effort. No accessory muscle use.  Cardiovascular: Regular rate and rhythm, no murmurs / rubs / gallops. No extremity edema.  Abdomen: Soft, nondistended no tenderness,Bowel sounds positive.  Musculoskeletal: no clubbing / cyanosis. No joint deformity upper and lower extremities.  Edema and erythema of the left dorsal hand radiating up to distal left forearm just above the elbow.  Small healed bite wound noted just cephalad to between the dorsal fourth and fifth MCP. skin is taut up to the forearm due to edema.  Pain with mild palpation.  No crepitus or fluctuant mass palpated.  Patient is still able to flex her fingers although with pain.     Skin: see MSK exam Neurologic: CN 2-12 grossly intact. Strength 5/5 in all 4.  Psychiatric: Normal judgment and insight. Alert and oriented x 3. Normal mood. Data Reviewed:  See HPI  Assessment and Plan: * Cellulitis Secondary to cat bite on around dorsal 4th MCP of left hand -Given IV vancomycin in ED. Will change to IV Unasyn  -pt still has good ROM, no fluctuance or crepitus on exam.  EDP discussed with hand surgery Dr. Marcelino Scot and no urgent intervention or wash out recommended at this point -Tetanus vaccine given  Hyperbilirubinemia Patient  has history of chronically elevated LFTs and elevated total bilirubin.  This was acutely worse today at 4.6 with no significant GI symptoms. -She has been worked up in the past at [facility] in 2021 with negative hepatitis B, C, HIV, TSH,IgG, CRP, ESR -Has GI appt scheduled outpatient -LDH and haptoglobin labs pending due to concomitant anemia  Macrocytic anemia Macrocytic anemia. -Patient has premenopausal symptoms.  Has not had a menstrual cycle for at least a month and a half.  However she has been dealing with GI issues for least 2 years with  persistent diarrhea which is worsened recently.  Suspect she could have issues with malnutrition. -Obtain vitamin B12, folate and iron panel -Total bili is also elevated at 4.6 up from 1.4.  Have low suspicion that it is hemolytic but will obtain LDH and haptoglobin.  Hypocalcemia Mild.  Oral calcium supplementation  Hypokalemia K 2.9. Replete with IV K 65mq x2      Advance Care Planning:   Code Status: Full Code   Consults: EDP discussed with hand surgeon Dr. HMarcelino Scot  Family Communication: Discussed with mother at bedside  Severity of Illness: The appropriate patient status for this patient is INPATIENT. Inpatient status is judged to be reasonable and necessary in order to provide the required intensity of service to ensure the patient's safety. The patient's presenting symptoms, physical exam findings, and initial radiographic and laboratory data in the context of their chronic comorbidities is felt to place them at high risk for further clinical deterioration. Furthermore, it is not anticipated that the patient will be medically stable for discharge from the hospital within 2 midnights of admission.   * I certify that at the point of admission it is my clinical judgment that the patient will require inpatient hospital care spanning beyond 2 midnights from the point of admission due to high intensity of service, high risk for further deterioration and high frequency of surveillance required.*  Author: COrene Desanctis DO 05/20/2022 8:30 PM  For on call review [email or web address]

## 2022-05-21 LAB — CBC
HCT: 24.4 % — ABNORMAL LOW (ref 36.0–46.0)
Hemoglobin: 8.4 g/dL — ABNORMAL LOW (ref 12.0–15.0)
MCH: 42.4 pg — ABNORMAL HIGH (ref 26.0–34.0)
MCHC: 34.4 g/dL (ref 30.0–36.0)
MCV: 123.2 fL — ABNORMAL HIGH (ref 80.0–100.0)
Platelets: 128 10*3/uL — ABNORMAL LOW (ref 150–400)
RBC: 1.98 MIL/uL — ABNORMAL LOW (ref 3.87–5.11)
RDW: 17.8 % — ABNORMAL HIGH (ref 11.5–15.5)
WBC: 6.6 10*3/uL (ref 4.0–10.5)
nRBC: 0 % (ref 0.0–0.2)

## 2022-05-21 LAB — COMPREHENSIVE METABOLIC PANEL
ALT: 28 U/L (ref 0–44)
AST: 48 U/L — ABNORMAL HIGH (ref 15–41)
Albumin: 2.7 g/dL — ABNORMAL LOW (ref 3.5–5.0)
Alkaline Phosphatase: 73 U/L (ref 38–126)
Anion gap: 8 (ref 5–15)
BUN: 5 mg/dL — ABNORMAL LOW (ref 6–20)
CO2: 26 mmol/L (ref 22–32)
Calcium: 7.3 mg/dL — ABNORMAL LOW (ref 8.9–10.3)
Chloride: 106 mmol/L (ref 98–111)
Creatinine, Ser: 0.59 mg/dL (ref 0.44–1.00)
GFR, Estimated: >60 mL/min (ref >60)
Glucose, Bld: 76 mg/dL (ref 70–99)
Potassium: 2.4 mmol/L — CL (ref 3.5–5.1)
Sodium: 140 mmol/L (ref 135–145)
Total Bilirubin: 2.8 mg/dL — ABNORMAL HIGH (ref 0.3–1.2)
Total Protein: 5.2 g/dL — ABNORMAL LOW (ref 6.5–8.1)

## 2022-05-21 LAB — FOLATE: Folate: 3.8 ng/mL — ABNORMAL LOW (ref >5.9)

## 2022-05-21 LAB — BASIC METABOLIC PANEL
Anion gap: 6 (ref 5–15)
BUN: 5 mg/dL — ABNORMAL LOW (ref 6–20)
CO2: 25 mmol/L (ref 22–32)
Calcium: 7.6 mg/dL — ABNORMAL LOW (ref 8.9–10.3)
Chloride: 106 mmol/L (ref 98–111)
Creatinine, Ser: 0.53 mg/dL (ref 0.44–1.00)
GFR, Estimated: >60 mL/min (ref >60)
Glucose, Bld: 129 mg/dL — ABNORMAL HIGH (ref 70–99)
Potassium: 3.1 mmol/L — ABNORMAL LOW (ref 3.5–5.1)
Sodium: 137 mmol/L (ref 135–145)

## 2022-05-21 LAB — IRON AND TIBC
Iron: 108 ug/dL (ref 28–170)
Saturation Ratios: 61 % — ABNORMAL HIGH (ref 10.4–31.8)
TIBC: 177 ug/dL — ABNORMAL LOW (ref 250–450)
UIBC: 69 ug/dL

## 2022-05-21 LAB — LACTATE DEHYDROGENASE: LDH: 285 U/L — ABNORMAL HIGH (ref 98–192)

## 2022-05-21 LAB — MAGNESIUM: Magnesium: 1.3 mg/dL — ABNORMAL LOW (ref 1.7–2.4)

## 2022-05-21 LAB — VITAMIN B12: Vitamin B-12: 163 pg/mL — ABNORMAL LOW (ref 180–914)

## 2022-05-21 MED ORDER — POTASSIUM CHLORIDE CRYS ER 20 MEQ PO TBCR
40.0000 meq | EXTENDED_RELEASE_TABLET | ORAL | Status: AC
  Administered 2022-05-21 (×2): 40 meq via ORAL
  Filled 2022-05-21 (×2): qty 2

## 2022-05-21 MED ORDER — POTASSIUM CHLORIDE CRYS ER 20 MEQ PO TBCR
60.0000 meq | EXTENDED_RELEASE_TABLET | ORAL | Status: AC
  Administered 2022-05-21 (×2): 60 meq via ORAL
  Filled 2022-05-21 (×2): qty 3

## 2022-05-21 MED ORDER — MAGNESIUM SULFATE 4 GM/100ML IV SOLN
4.0000 g | Freq: Once | INTRAVENOUS | Status: AC
  Administered 2022-05-21: 4 g via INTRAVENOUS
  Filled 2022-05-21: qty 100

## 2022-05-21 MED ORDER — VITAMIN B-12 1000 MCG PO TABS
1000.0000 ug | ORAL_TABLET | Freq: Every day | ORAL | Status: DC
  Administered 2022-05-21 – 2022-05-22 (×2): 1000 ug via ORAL
  Filled 2022-05-21 (×2): qty 1

## 2022-05-21 MED ORDER — FOLIC ACID 1 MG PO TABS
1.0000 mg | ORAL_TABLET | Freq: Every day | ORAL | Status: DC
  Administered 2022-05-21 – 2022-05-22 (×2): 1 mg via ORAL
  Filled 2022-05-21 (×2): qty 1

## 2022-05-21 MED ORDER — TRAZODONE HCL 50 MG PO TABS
25.0000 mg | ORAL_TABLET | Freq: Once | ORAL | Status: DC
  Filled 2022-05-21: qty 1

## 2022-05-21 NOTE — Progress Notes (Signed)
Progress Note   Patient: Holly Ingram RRN:[id number] DOB: 22-Aug-1975 DOA: 05/20/2022     1 DOS: the patient was seen and examined on 05/21/2022   Brief hospital course: 46 y.o. female with medical history significant of scoliosis who presents with cat bite and worsening erythema edema.   Patient was playing with a friend's cat 2 days ago and was bit around her left dorsal hand around fourth MCP.  Cat up-to-date with vaccination.  Initially had swelling up to her wrist.  The following day woke up with worsening tightness of her edema and increased difficulty in flexing her fingers and holding a fist.  In the next few hours redness progressively spread all the way to her upper forearm.  Had increasing pain.  She also had nausea but no vomiting.   In the ED, she was afebrile, normotensive on room air.    WBC of 14.5, hemoglobin of 10.1 with MCV of 118.  Lactate of 3.4.   Potassium of 2.9.  Creatinine 0.81.  Mildly elevated AST 54, ALT 20, total bilirubin 4.6 which has upward trended from 1.4 from just 2 months ago.  Assessment and Plan: * Cellulitis Secondary to cat bite on around dorsal 4th MCP of left hand -Given IV vancomycin in ED. Continued on IV Unasyn  -pt still has good ROM, no fluctuance or crepitus on exam.  EDP discussed with hand surgery Dr. Marcelino Scot and no urgent intervention or wash out recommended at this point -Tetanus vaccine given -Hand swelling and erythema much improved today -Recheck CBC in AM   Hyperbilirubinemia Patient has history of chronically elevated LFTs and elevated total bilirubin.  This was acutely worse today at 4.6 with no significant GI symptoms. -She has been worked up in the past at [facility] in 2021 with negative hepatitis B, C, HIV, TSH,IgG, CRP, ESR -Has GI appt scheduled outpatient -LDH and haptoglobin labs pending due to concomitant anemia   Macrocytic anemia Macrocytic anemia. -Patient has premenopausal symptoms.  Has not had a menstrual cycle for at  least a month and a half.  However she has been dealing with GI issues for least 2 years with persistent diarrhea which is worsened recently.  Suspect she could have issues with malnutrition. -Low b12 and folate, will replenish -Iron levels normal   Hypocalcemia Mild.  Oral calcium supplementation   Hypokalemia -remains low, cont to replace -recheck bmet in AM  Hypomagnesemia -Replaced       Subjective: Feeling better today. States hand redness and swelling is much better  Physical Exam: Vitals:   05/21/22 0220 05/21/22 0622 05/21/22 1357 05/21/22 1405  BP: (!) 147/82 (!) 155/86 (!) 168/92 (!) 170/94  Pulse: 71 68 77 71  Resp: 18 20 20    Temp: 98.4 F (36.9 C) 98.6 F (37 C) 98.4 F (36.9 C)   TempSrc: Oral Oral Oral   SpO2: 99% 100% 100%   Weight:      Height:       General exam: Awake, laying in bed, in nad Respiratory system: Normal respiratory effort, no wheezing Cardiovascular system: regular rate, s1, s2 Gastrointestinal system: Soft, nondistended, positive BS Central nervous system: CN2-12 grossly intact, strength intact Extremities: Perfused, no clubbing, L arm with resolving swelling and erythema Skin: Normal skin turgor, no notable skin lesions seen Psychiatry: Mood normal // no visual hallucinations   Data Reviewed:  Labs reviewed: Na 140, K 2.4, Cr 0.59, WBC 6.6, MG 1.3  Family Communication: Pt in room, family at bedside  Disposition: Status  is: Inpatient Remains inpatient appropriate because: Severity of illness  Planned Discharge Destination: Home    Author: Marylu Lund, MD 05/21/2022 5:12 PM  For on call review [email or web address].

## 2022-05-21 NOTE — Hospital Course (Signed)
46 y.o. female with medical history significant of scoliosis who presents with cat bite and worsening erythema edema.   Patient was playing with a friend's cat 2 days ago and was bit around her left dorsal hand around fourth MCP.  Cat up-to-date with vaccination.  Initially had swelling up to her wrist.  The following day woke up with worsening tightness of her edema and increased difficulty in flexing her fingers and holding a fist.  In the next few hours redness progressively spread all the way to her upper forearm.  Had increasing pain.  She also had nausea but no vomiting.   In the ED, she was afebrile, normotensive on room air.    WBC of 14.5, hemoglobin of 10.1 with MCV of 118.  Lactate of 3.4.   Potassium of 2.9.  Creatinine 0.81.  Mildly elevated AST 54, ALT 20, total bilirubin 4.6 which has upward trended from 1.4 from just 2 months ago.

## 2022-05-22 LAB — CBC
HCT: 27.5 % — ABNORMAL LOW (ref 36.0–46.0)
Hemoglobin: 9.5 g/dL — ABNORMAL LOW (ref 12.0–15.0)
MCH: 42.8 pg — ABNORMAL HIGH (ref 26.0–34.0)
MCHC: 34.5 g/dL (ref 30.0–36.0)
MCV: 123.9 fL — ABNORMAL HIGH (ref 80.0–100.0)
Platelets: 160 10*3/uL (ref 150–400)
RBC: 2.22 MIL/uL — ABNORMAL LOW (ref 3.87–5.11)
RDW: 17.8 % — ABNORMAL HIGH (ref 11.5–15.5)
WBC: 5.5 10*3/uL (ref 4.0–10.5)
nRBC: 0.4 % — ABNORMAL HIGH (ref 0.0–0.2)

## 2022-05-22 LAB — COMPREHENSIVE METABOLIC PANEL
ALT: 34 U/L (ref 0–44)
AST: 67 U/L — ABNORMAL HIGH (ref 15–41)
Albumin: 3.1 g/dL — ABNORMAL LOW (ref 3.5–5.0)
Alkaline Phosphatase: 96 U/L (ref 38–126)
Anion gap: 6 (ref 5–15)
BUN: <5 mg/dL — ABNORMAL LOW (ref 6–20)
CO2: 23 mmol/L (ref 22–32)
Calcium: 8 mg/dL — ABNORMAL LOW (ref 8.9–10.3)
Chloride: 112 mmol/L — ABNORMAL HIGH (ref 98–111)
Creatinine, Ser: 0.45 mg/dL (ref 0.44–1.00)
GFR, Estimated: >60 mL/min (ref >60)
Glucose, Bld: 82 mg/dL (ref 70–99)
Potassium: 4.3 mmol/L (ref 3.5–5.1)
Sodium: 141 mmol/L (ref 135–145)
Total Bilirubin: 1.4 mg/dL — ABNORMAL HIGH (ref 0.3–1.2)
Total Protein: 6.4 g/dL — ABNORMAL LOW (ref 6.5–8.1)

## 2022-05-22 LAB — MAGNESIUM: Magnesium: 2.2 mg/dL (ref 1.7–2.4)

## 2022-05-22 LAB — HAPTOGLOBIN: Haptoglobin: 82 mg/dL (ref 42–296)

## 2022-05-22 MED ORDER — CYCLOBENZAPRINE HCL 5 MG PO TABS: 5.0000 mg | ORAL_TABLET | Freq: Every evening | ORAL | 0 refills | Status: DC | PRN

## 2022-05-22 MED ORDER — FOLIC ACID 1 MG PO TABS: 1.0000 mg | ORAL_TABLET | Freq: Every day | ORAL | 0 refills | Status: AC

## 2022-05-22 MED ORDER — AMOXICILLIN-POT CLAVULANATE 875-125 MG PO TABS: 1.0000 | ORAL_TABLET | Freq: Two times a day (BID) | ORAL | 0 refills | Status: AC

## 2022-05-22 MED ORDER — POTASSIUM CHLORIDE CRYS ER 20 MEQ PO TBCR: 20.0000 meq | EXTENDED_RELEASE_TABLET | Freq: Every day | ORAL | 0 refills | Status: DC

## 2022-05-22 MED ORDER — CYANOCOBALAMIN 1000 MCG PO TABS: 1000.0000 ug | ORAL_TABLET | Freq: Every day | ORAL | 0 refills | Status: AC

## 2022-05-22 MED ORDER — FLUCONAZOLE 150 MG PO TABS: 150.0000 mg | ORAL_TABLET | ORAL | 0 refills | Status: DC | PRN

## 2022-05-22 NOTE — Discharge Summary (Signed)
Physician Discharge Summary   Patient: Holly Ingram MRN: [id number] DOB: [birth year]  Admit date:     05/20/2022  Discharge date: 05/22/22  Discharge Physician: Marylu Lund   PCP: Patient, No Pcp Per   Recommendations at discharge:    Follow up with PCP as scheduled within one week Recommend recheck BMET and Mg within one week, focus on K and Mg levels  Discharge Diagnoses: Principal Problem:   Cellulitis Active Problems:   Hypokalemia   Cat bite   Hypocalcemia   Macrocytic anemia   Hyperbilirubinemia  Resolved Problems:   * No resolved hospital problems. *  Hospital Course: 46 y.o. female with medical history significant of scoliosis who presents with cat bite and worsening erythema edema.   Patient was playing with a friend's cat 2 days ago and was bit around her left dorsal hand around fourth MCP.  Cat up-to-date with vaccination.  Initially had swelling up to her wrist.  The following day woke up with worsening tightness of her edema and increased difficulty in flexing her fingers and holding a fist.  In the next few hours redness progressively spread all the way to her upper forearm.  Had increasing pain.  She also had nausea but no vomiting.   In the ED, she was afebrile, normotensive on room air.    WBC of 14.5, hemoglobin of 10.1 with MCV of 118.  Lactate of 3.4.   Potassium of 2.9.  Creatinine 0.81.  Mildly elevated AST 54, ALT 20, total bilirubin 4.6 which has upward trended from 1.4 from just 2 months ago.  Assessment and Plan: * Cellulitis Secondary to cat bite on around dorsal 4th MCP of left hand -Given IV vancomycin in ED. Continued on IV Unasyn  -pt still has good ROM, no fluctuance or crepitus on exam.  EDP discussed with hand surgery Dr. Marcelino Scot and no urgent intervention or wash out recommended at this point -Tetanus vaccine given -Hand swelling and erythema much improved  -Will complete course of augmentin on d/c   Hyperbilirubinemia Patient has  history of chronically elevated LFTs and elevated total bilirubin.  This was acutely worse today at 4.6 with no significant GI symptoms. -She has been worked up in the past at [facility] in 2021 with negative hepatitis B, C, HIV, TSH,IgG, CRP, ESR -Has GI appt scheduled outpatient   Macrocytic anemia Macrocytic anemia. -Patient has premenopausal symptoms.  Has not had a menstrual cycle for at least a month and a half.  However she has been dealing with GI issues for least 2 years with persistent diarrhea which is worsened recently.  Suspect she could have issues with malnutrition. -Low b12 and folate, will replenish -Iron levels normal   Hypocalcemia Mild.  Oral calcium supplementation   Hypokalemia -Corrected -recommend recheck BMET and Mg within one week   Hypomagnesemia -Replaced        Consultants:  Procedures performed:   Disposition: Home Diet recommendation:  Regular diet DISCHARGE MEDICATION: Allergies as of 05/22/2022       Reactions   Morphine Rash        Medication List     TAKE these medications    amoxicillin-clavulanate 875-125 MG tablet Commonly known as: AUGMENTIN Take 1 tablet by mouth 2 (two) times daily for 5 days.   BIOFREEZE EX Apply 1 Application topically in the morning, at noon, in the evening, and at bedtime.   CLEAR EYES ALL SEASONS OP Place 2 drops into both eyes in the morning and at bedtime.  cyanocobalamin 1000 MCG tablet Take 1 tablet (1,000 mcg total) by mouth daily. Start taking on: May 23, 2022   cyclobenzaprine 5 MG tablet Commonly known as: FLEXERIL Take 1 tablet (5 mg total) by mouth at bedtime as needed for muscle spasms.   fluconazole 150 MG tablet Commonly known as: Diflucan Take 1 tablet (150 mg total) by mouth as needed (for yeast infection).   folic acid 1 MG tablet Commonly known as: FOLVITE Take 1 tablet (1 mg total) by mouth daily. Start taking on: May 23, 2022   ibuprofen 200 MG tablet Commonly  known as: ADVIL Take 1,000 mg by mouth daily as needed (pain).   multivitamin with minerals tablet Take 1 tablet by mouth daily.   OVER THE COUNTER MEDICATION Take 1 tablet by mouth daily as needed (heartburn). Heartburn Relief medication OTC   OVER THE COUNTER MEDICATION Apply 1 Application topically in the morning, at noon, in the evening, and at bedtime. Icy Hot EX cream   OVER THE COUNTER MEDICATION Apply 1 Application topically daily as needed (pain). Theraworx Spray   potassium chloride SA 20 MEQ tablet Commonly known as: KLOR-CON M Take 1 tablet (20 mEq total) by mouth daily for 5 days.   VITAMIN C CR 1500 MG Tbcr Take 1,500 mg by mouth daily.        Follow-up Information     Costella, Vista Mink, PA-C Follow up.   Specialty: Physician Assistant Why: Hospital follow up Contact information: [place] [phone]                Discharge Exam: Danley Danker Weights   05/20/22 1410  Weight: 56.2 kg   General exam: Awake, laying in bed, in nad Respiratory system: Normal respiratory effort, no wheezing Cardiovascular system: regular rate, s1, s2 Gastrointestinal system: Soft, nondistended, positive BS Central nervous system: CN2-12 grossly intact, strength intact Extremities: Perfused, no clubbing Skin: Normal skin turgor, no notable skin lesions seen Psychiatry: Mood normal // no visual hallucinations   Condition at discharge: fair  The results of significant diagnostics from this hospitalization (including imaging, microbiology, ancillary and laboratory) are listed below for reference.   Imaging Studies: DG Hand Complete Left  Result Date: 05/20/2022 CLINICAL DATA:  Cat bite EXAM: LEFT HAND - COMPLETE 3 VIEW COMPARISON:  None Available. FINDINGS: There is no evidence of fracture or dislocation. There is no evidence of arthropathy or other focal bone abnormality. Moderate palmar soft tissue swelling. No radiopaque foreign body.  IMPRESSION: No acute fracture or radiopaque foreign body. Electronically Signed   By: Beryle Flock M.D.   On: 05/20/2022 15:38    Microbiology: Results for orders placed or performed during the hospital encounter of 05/20/22  Culture, blood (routine x 2)     Status: None (Preliminary result)   Collection Time: 05/20/22  3:46 PM   Specimen: BLOOD RIGHT FOREARM  Result Value Ref Range Status   Specimen Description   Final    BLOOD RIGHT FOREARM Performed at [facility], [address], [place], [place] [address]    Special Requests   Final    BOTTLES DRAWN AEROBIC AND ANAEROBIC Blood Culture results may not be optimal due to an excessive volume of blood received in culture bottles Performed at [facility], [address] [place], [place] [address]    Culture   Final    NO GROWTH < 24 HOURS Performed at [facility], [place] [address] [place], [place]  [address]    Report Status PENDING  Incomplete  Culture, blood (routine x 2)     Status: None (Preliminary result)   Collection Time: 05/20/22  3:55 PM   Specimen: BLOOD  Result Value Ref Range Status   Specimen Description   Final    BLOOD RIGHT ANTECUBITAL Performed at [facility], [address], [place], [place] [address]    Special Requests   Final    BOTTLES DRAWN AEROBIC AND ANAEROBIC Blood Culture results may not be optimal due to an excessive volume of blood received in culture bottles Performed at [facility], [address] [place], [place] [address]    Culture   Final    NO GROWTH < 24 HOURS Performed at [facility], [place] [address], [place], [place] [address]    Report Status PENDING  Incomplete    Labs: CBC: Recent Labs  Lab 05/20/22 1421 05/21/22 0539 05/22/22 0629  WBC 14.5* 6.6 5.5  NEUTROABS 13.2*  --   --   HGB 10.1* 8.4* 9.5*  HCT 27.9* 24.4* 27.5*  MCV 118.2* 123.2* 123.9*  PLT 163 128* 971   Basic  Metabolic Panel: Recent Labs  Lab 05/20/22 1421 05/21/22 0539 05/21/22 0830 05/21/22 1543 05/22/22 0629  NA 142 140  --  137 141  K 2.9* 2.4*  --  3.1* 4.3  CL 99 106  --  106 112*  CO2 30 26  --  25 23  GLUCOSE 131* 76  --  129* 82  BUN 6 5*  --  5* <5*  CREATININE 0.81 0.59  --  0.53 0.45  CALCIUM 8.8* 7.3*  --  7.6* 8.0*  MG  --   --  1.3*  --  2.2   Liver Function Tests: Recent Labs  Lab 05/20/22 1421 05/21/22 0539 05/22/22 0629  AST 54* 48* 67*  ALT 29 28 34  ALKPHOS 96 73 96  BILITOT 4.6* 2.8* 1.4*  PROT 6.4* 5.2* 6.4*  ALBUMIN 3.5 2.7* 3.1*   CBG: No results for input(s): "GLUCAP" in the last 168 hours.  Discharge time spent: less than 30 minutes.  Signed: Marylu Lund, MD [facility] 05/22/2022

## 2022-05-25 LAB — CULTURE, BLOOD (ROUTINE X 2)
Culture: NO GROWTH
Culture: NO GROWTH

## 2022-08-20 DIAGNOSIS — Z0000 Encounter for general adult medical examination without abnormal findings: Secondary | ICD-10-CM | POA: Diagnosis not present

## 2022-08-20 DIAGNOSIS — Z13220 Encounter for screening for lipoid disorders: Secondary | ICD-10-CM | POA: Diagnosis not present

## 2022-08-20 DIAGNOSIS — G8929 Other chronic pain: Secondary | ICD-10-CM | POA: Diagnosis not present

## 2022-08-20 DIAGNOSIS — R109 Unspecified abdominal pain: Secondary | ICD-10-CM | POA: Diagnosis not present

## 2022-08-20 DIAGNOSIS — E559 Vitamin D deficiency, unspecified: Secondary | ICD-10-CM | POA: Diagnosis not present

## 2022-09-11 DIAGNOSIS — Z1211 Encounter for screening for malignant neoplasm of colon: Secondary | ICD-10-CM | POA: Diagnosis not present

## 2022-09-11 DIAGNOSIS — K529 Noninfective gastroenteritis and colitis, unspecified: Secondary | ICD-10-CM | POA: Diagnosis not present

## 2022-09-11 DIAGNOSIS — R634 Abnormal weight loss: Secondary | ICD-10-CM | POA: Diagnosis not present

## 2022-09-11 DIAGNOSIS — R12 Heartburn: Secondary | ICD-10-CM | POA: Diagnosis not present

## 2022-09-11 DIAGNOSIS — R1319 Other dysphagia: Secondary | ICD-10-CM | POA: Diagnosis not present

## 2022-09-11 DIAGNOSIS — R1110 Vomiting, unspecified: Secondary | ICD-10-CM | POA: Diagnosis not present

## 2022-09-19 DIAGNOSIS — M4126 Other idiopathic scoliosis, lumbar region: Secondary | ICD-10-CM | POA: Diagnosis not present

## 2022-09-19 DIAGNOSIS — M5416 Radiculopathy, lumbar region: Secondary | ICD-10-CM | POA: Diagnosis not present

## 2022-09-27 DIAGNOSIS — M5416 Radiculopathy, lumbar region: Secondary | ICD-10-CM | POA: Diagnosis not present

## 2022-09-27 DIAGNOSIS — Z135 Encounter for screening for eye and ear disorders: Secondary | ICD-10-CM | POA: Diagnosis not present

## 2022-10-04 DIAGNOSIS — Z1211 Encounter for screening for malignant neoplasm of colon: Secondary | ICD-10-CM | POA: Diagnosis not present

## 2022-10-08 DIAGNOSIS — R1319 Other dysphagia: Secondary | ICD-10-CM | POA: Diagnosis not present

## 2022-10-11 DIAGNOSIS — R892 Abnormal level of other drugs, medicaments and biological substances in specimens from other organs, systems and tissues: Secondary | ICD-10-CM | POA: Diagnosis not present

## 2022-10-12 DIAGNOSIS — Z79899 Other long term (current) drug therapy: Secondary | ICD-10-CM | POA: Diagnosis not present

## 2022-10-13 DIAGNOSIS — K529 Noninfective gastroenteritis and colitis, unspecified: Secondary | ICD-10-CM | POA: Diagnosis not present

## 2022-10-16 DIAGNOSIS — K200 Eosinophilic esophagitis: Secondary | ICD-10-CM | POA: Diagnosis not present

## 2022-10-31 ENCOUNTER — Ambulatory Visit: Payer: Medicaid Other

## 2023-01-10 DIAGNOSIS — R109 Unspecified abdominal pain: Secondary | ICD-10-CM | POA: Diagnosis not present

## 2023-01-10 DIAGNOSIS — R110 Nausea: Secondary | ICD-10-CM | POA: Diagnosis not present

## 2023-01-10 DIAGNOSIS — G8929 Other chronic pain: Secondary | ICD-10-CM | POA: Diagnosis not present

## 2023-01-10 DIAGNOSIS — K582 Mixed irritable bowel syndrome: Secondary | ICD-10-CM | POA: Diagnosis not present

## 2023-01-10 DIAGNOSIS — K909 Intestinal malabsorption, unspecified: Secondary | ICD-10-CM | POA: Diagnosis not present

## 2023-01-10 DIAGNOSIS — R140 Abdominal distension (gaseous): Secondary | ICD-10-CM | POA: Diagnosis not present

## 2023-01-10 DIAGNOSIS — K2950 Unspecified chronic gastritis without bleeding: Secondary | ICD-10-CM | POA: Diagnosis not present

## 2023-03-05 ENCOUNTER — Observation Stay (HOSPITAL_BASED_OUTPATIENT_CLINIC_OR_DEPARTMENT_OTHER)
Admission: EM | Admit: 2023-03-05 | Discharge: 2023-03-08 | Disposition: A | Discharge status: Home / Self Care | Payer: Medicaid Other | Attending: Emergency Medicine | Admitting: Emergency Medicine

## 2023-03-05 ENCOUNTER — Encounter (HOSPITAL_BASED_OUTPATIENT_CLINIC_OR_DEPARTMENT_OTHER): Payer: Self-pay | Admitting: Emergency Medicine

## 2023-03-05 ENCOUNTER — Emergency Department (HOSPITAL_BASED_OUTPATIENT_CLINIC_OR_DEPARTMENT_OTHER): Payer: Medicaid Other

## 2023-03-05 ENCOUNTER — Emergency Department (HOSPITAL_BASED_OUTPATIENT_CLINIC_OR_DEPARTMENT_OTHER): Payer: Medicaid Other | Admitting: Radiology

## 2023-03-05 ENCOUNTER — Other Ambulatory Visit: Payer: Self-pay

## 2023-03-05 DIAGNOSIS — D72819 Decreased white blood cell count, unspecified: Secondary | ICD-10-CM | POA: Insufficient documentation

## 2023-03-05 DIAGNOSIS — E872 Acidosis, unspecified: Secondary | ICD-10-CM | POA: Diagnosis not present

## 2023-03-05 DIAGNOSIS — F17210 Nicotine dependence, cigarettes, uncomplicated: Secondary | ICD-10-CM | POA: Insufficient documentation

## 2023-03-05 DIAGNOSIS — L03115 Cellulitis of right lower limb: Secondary | ICD-10-CM

## 2023-03-05 DIAGNOSIS — D7589 Other specified diseases of blood and blood-forming organs: Secondary | ICD-10-CM | POA: Diagnosis present

## 2023-03-05 DIAGNOSIS — F1010 Alcohol abuse, uncomplicated: Secondary | ICD-10-CM | POA: Insufficient documentation

## 2023-03-05 DIAGNOSIS — W5501XA Bitten by cat, initial encounter: Secondary | ICD-10-CM | POA: Diagnosis not present

## 2023-03-05 DIAGNOSIS — Z23 Encounter for immunization: Secondary | ICD-10-CM | POA: Insufficient documentation

## 2023-03-05 DIAGNOSIS — M109 Gout, unspecified: Secondary | ICD-10-CM | POA: Diagnosis not present

## 2023-03-05 DIAGNOSIS — R600 Localized edema: Secondary | ICD-10-CM

## 2023-03-05 DIAGNOSIS — M79671 Pain in right foot: Secondary | ICD-10-CM | POA: Diagnosis not present

## 2023-03-05 DIAGNOSIS — M7989 Other specified soft tissue disorders: Principal | ICD-10-CM | POA: Insufficient documentation

## 2023-03-05 DIAGNOSIS — D539 Nutritional anemia, unspecified: Secondary | ICD-10-CM | POA: Diagnosis not present

## 2023-03-05 DIAGNOSIS — Z885 Allergy status to narcotic agent status: Secondary | ICD-10-CM | POA: Insufficient documentation

## 2023-03-05 DIAGNOSIS — D61818 Other pancytopenia: Secondary | ICD-10-CM | POA: Diagnosis present

## 2023-03-05 DIAGNOSIS — E876 Hypokalemia: Secondary | ICD-10-CM | POA: Diagnosis not present

## 2023-03-05 DIAGNOSIS — Z79899 Other long term (current) drug therapy: Secondary | ICD-10-CM | POA: Insufficient documentation

## 2023-03-05 DIAGNOSIS — R7989 Other specified abnormal findings of blood chemistry: Secondary | ICD-10-CM | POA: Diagnosis not present

## 2023-03-05 DIAGNOSIS — L0390 Cellulitis, unspecified: Secondary | ICD-10-CM | POA: Diagnosis present

## 2023-03-05 DIAGNOSIS — D6959 Other secondary thrombocytopenia: Secondary | ICD-10-CM | POA: Insufficient documentation

## 2023-03-05 DIAGNOSIS — R7401 Elevation of levels of liver transaminase levels: Secondary | ICD-10-CM | POA: Insufficient documentation

## 2023-03-05 DIAGNOSIS — D696 Thrombocytopenia, unspecified: Secondary | ICD-10-CM | POA: Insufficient documentation

## 2023-03-05 LAB — LACTIC ACID, PLASMA
Lactic Acid, Venous: 3 mmol/L (ref 0.5–1.9)
Lactic Acid, Venous: 3.3 mmol/L (ref 0.5–1.9)
Lactic Acid, Venous: 2.6 mmol/L (ref 0.5–1.9)

## 2023-03-05 LAB — PROTIME-INR
INR: 1 (ref 0.8–1.2)
Prothrombin Time: 13.7 seconds (ref 11.4–15.2)

## 2023-03-05 LAB — COMPREHENSIVE METABOLIC PANEL
ALT: 40 U/L (ref 0–44)
AST: 78 U/L — ABNORMAL HIGH (ref 15–41)
Albumin: 4.2 g/dL (ref 3.5–5.0)
Alkaline Phosphatase: 114 U/L (ref 38–126)
Anion gap: 10 (ref 5–15)
BUN: <5 mg/dL — ABNORMAL LOW (ref 6–20)
CO2: 27 mmol/L (ref 22–32)
Calcium: 9.1 mg/dL (ref 8.9–10.3)
Chloride: 102 mmol/L (ref 98–111)
Creatinine, Ser: 0.52 mg/dL (ref 0.44–1.00)
GFR, Estimated: >60 mL/min (ref >60)
Glucose, Bld: 94 mg/dL (ref 70–99)
Potassium: 3 mmol/L — ABNORMAL LOW (ref 3.5–5.1)
Sodium: 139 mmol/L (ref 135–145)
Total Bilirubin: 2.2 mg/dL — ABNORMAL HIGH (ref 0.3–1.2)
Total Protein: 6.8 g/dL (ref 6.5–8.1)

## 2023-03-05 LAB — C-REACTIVE PROTEIN: CRP: 0.8 mg/dL (ref <1.0)

## 2023-03-05 LAB — CBC WITH DIFFERENTIAL/PLATELET
Abs Immature Granulocytes: 0.02 10*3/uL (ref 0.00–0.07)
Basophils Absolute: 0 10*3/uL (ref 0.0–0.1)
Basophils Relative: 1 %
Eosinophils Absolute: 0.1 10*3/uL (ref 0.0–0.5)
Eosinophils Relative: 1 %
HCT: 25.8 % — ABNORMAL LOW (ref 36.0–46.0)
Hemoglobin: 9.3 g/dL — ABNORMAL LOW (ref 12.0–15.0)
Immature Granulocytes: 0 %
Lymphocytes Relative: 20 %
Lymphs Abs: 1.1 10*3/uL (ref 0.7–4.0)
MCH: 49.2 pg — ABNORMAL HIGH (ref 26.0–34.0)
MCHC: 36 g/dL (ref 30.0–36.0)
MCV: 136.5 fL — ABNORMAL HIGH (ref 80.0–100.0)
Monocytes Absolute: 0.5 10*3/uL (ref 0.1–1.0)
Monocytes Relative: 10 %
Neutro Abs: 3.5 10*3/uL (ref 1.7–7.7)
Neutrophils Relative %: 68 %
Platelets: 169 10*3/uL (ref 150–400)
RBC: 1.89 MIL/uL — ABNORMAL LOW (ref 3.87–5.11)
RDW: 15 % (ref 11.5–15.5)
WBC: 5.1 10*3/uL (ref 4.0–10.5)
nRBC: 0 % (ref 0.0–0.2)

## 2023-03-05 LAB — URINALYSIS, ROUTINE W REFLEX MICROSCOPIC
Bilirubin Urine: NEGATIVE
Glucose, UA: NEGATIVE mg/dL
Hgb urine dipstick: NEGATIVE
Ketones, ur: NEGATIVE mg/dL
Nitrite: NEGATIVE
Specific Gravity, Urine: 1.01 (ref 1.005–1.030)
pH: 8 (ref 5.0–8.0)

## 2023-03-05 LAB — SEDIMENTATION RATE: Sed Rate: 16 mm/hr (ref 0–22)

## 2023-03-05 MED ORDER — VANCOMYCIN HCL IN DEXTROSE 1-5 GM/200ML-% IV SOLN
1000.0000 mg | Freq: Once | INTRAVENOUS | Status: AC
  Administered 2023-03-05: 1000 mg via INTRAVENOUS
  Filled 2023-03-05: qty 200

## 2023-03-05 MED ORDER — SODIUM CHLORIDE 0.9 % IV SOLN
1.0000 g | INTRAVENOUS | Status: DC
  Administered 2023-03-05: 1 g via INTRAVENOUS
  Filled 2023-03-05: qty 10

## 2023-03-05 MED ORDER — LACTATED RINGERS IV BOLUS
1000.0000 mL | Freq: Once | INTRAVENOUS | Status: AC
  Administered 2023-03-05: 1000 mL via INTRAVENOUS

## 2023-03-05 MED ORDER — ENOXAPARIN SODIUM 40 MG/0.4ML IJ SOSY
40.0000 mg | PREFILLED_SYRINGE | INTRAMUSCULAR | Status: DC
  Administered 2023-03-05 – 2023-03-07 (×3): 40 mg via SUBCUTANEOUS
  Filled 2023-03-05 (×3): qty 0.4

## 2023-03-05 MED ORDER — HYDROMORPHONE HCL 1 MG/ML IJ SOLN
1.0000 mg | Freq: Once | INTRAMUSCULAR | Status: AC
  Administered 2023-03-05: 1 mg via INTRAVENOUS
  Filled 2023-03-05: qty 1

## 2023-03-05 MED ORDER — HYDROMORPHONE HCL 1 MG/ML IJ SOLN
0.5000 mg | Freq: Once | INTRAMUSCULAR | Status: AC
  Administered 2023-03-05: 0.5 mg via INTRAVENOUS
  Filled 2023-03-05: qty 1

## 2023-03-05 MED ORDER — ACETAMINOPHEN 650 MG RE SUPP: 650.0000 mg | Freq: Four times a day (QID) | RECTAL | Status: DC | PRN

## 2023-03-05 MED ORDER — HYDROMORPHONE HCL 1 MG/ML IJ SOLN
0.5000 mg | INTRAMUSCULAR | Status: DC | PRN
  Administered 2023-03-05 – 2023-03-06 (×2): 1 mg via INTRAVENOUS
  Filled 2023-03-05 (×2): qty 1

## 2023-03-05 MED ORDER — ONDANSETRON HCL 4 MG PO TABS
4.0000 mg | ORAL_TABLET | Freq: Four times a day (QID) | ORAL | Status: DC | PRN
  Administered 2023-03-07: 4 mg via ORAL
  Filled 2023-03-05: qty 1

## 2023-03-05 MED ORDER — OXYCODONE HCL 5 MG PO TABS
5.0000 mg | ORAL_TABLET | ORAL | Status: DC | PRN
  Administered 2023-03-06 – 2023-03-08 (×11): 5 mg via ORAL
  Filled 2023-03-05 (×11): qty 1

## 2023-03-05 MED ORDER — TETANUS-DIPHTH-ACELL PERTUSSIS 5-2.5-18.5 LF-MCG/0.5 IM SUSY
0.5000 mL | PREFILLED_SYRINGE | Freq: Once | INTRAMUSCULAR | Status: AC
  Administered 2023-03-05: 0.5 mL via INTRAMUSCULAR
  Filled 2023-03-05: qty 0.5

## 2023-03-05 MED ORDER — ONDANSETRON HCL 4 MG/2ML IJ SOLN
4.0000 mg | Freq: Four times a day (QID) | INTRAMUSCULAR | Status: DC | PRN
  Administered 2023-03-06: 4 mg via INTRAVENOUS
  Filled 2023-03-05: qty 2

## 2023-03-05 MED ORDER — POLYETHYLENE GLYCOL 3350 17 G PO PACK
17.0000 g | PACK | Freq: Every day | ORAL | Status: DC | PRN
  Administered 2023-03-07: 17 g via ORAL
  Filled 2023-03-05: qty 1

## 2023-03-05 MED ORDER — METHOCARBAMOL 500 MG PO TABS: 500.0000 mg | ORAL_TABLET | Freq: Two times a day (BID) | ORAL | Status: DC | PRN

## 2023-03-05 MED ORDER — VANCOMYCIN HCL 1250 MG/250ML IV SOLN
1250.0000 mg | Freq: Once | INTRAVENOUS | Status: DC
  Filled 2023-03-05: qty 250

## 2023-03-05 MED ORDER — MELATONIN 3 MG PO TABS
3.0000 mg | ORAL_TABLET | Freq: Every evening | ORAL | Status: DC | PRN
  Administered 2023-03-05: 3 mg via ORAL
  Filled 2023-03-05: qty 1

## 2023-03-05 MED ORDER — POTASSIUM CHLORIDE CRYS ER 20 MEQ PO TBCR
40.0000 meq | EXTENDED_RELEASE_TABLET | Freq: Once | ORAL | Status: AC
  Administered 2023-03-05: 40 meq via ORAL
  Filled 2023-03-05: qty 2

## 2023-03-05 MED ORDER — KETOROLAC TROMETHAMINE 15 MG/ML IJ SOLN
15.0000 mg | Freq: Once | INTRAMUSCULAR | Status: AC
  Administered 2023-03-05: 15 mg via INTRAVENOUS
  Filled 2023-03-05: qty 1

## 2023-03-05 MED ORDER — SODIUM CHLORIDE 0.9 % IV SOLN: Freq: Once | INTRAVENOUS | Status: DC

## 2023-03-05 MED ORDER — SODIUM CHLORIDE 0.9 % IV SOLN
3.0000 g | Freq: Four times a day (QID) | INTRAVENOUS | Status: DC
  Administered 2023-03-05 – 2023-03-07 (×6): 3 g via INTRAVENOUS
  Filled 2023-03-05 (×7): qty 8

## 2023-03-05 MED ORDER — SODIUM CHLORIDE 0.9 % IV SOLN: INTRAVENOUS | Status: DC

## 2023-03-05 MED ORDER — GABAPENTIN 100 MG PO CAPS
100.0000 mg | ORAL_CAPSULE | Freq: Three times a day (TID) | ORAL | Status: DC
  Administered 2023-03-05 – 2023-03-08 (×8): 100 mg via ORAL
  Filled 2023-03-05 (×8): qty 1

## 2023-03-05 MED ORDER — IOHEXOL 300 MG/ML  SOLN
100.0000 mL | Freq: Once | INTRAMUSCULAR | Status: AC | PRN
  Administered 2023-03-05: 75 mL via INTRAVENOUS

## 2023-03-05 MED ORDER — VANCOMYCIN HCL 1250 MG/250ML IV SOLN
1250.0000 mg | INTRAVENOUS | Status: DC
  Filled 2023-03-05: qty 250

## 2023-03-05 MED ORDER — ACETAMINOPHEN 325 MG PO TABS
650.0000 mg | ORAL_TABLET | Freq: Four times a day (QID) | ORAL | Status: DC | PRN
  Administered 2023-03-06 – 2023-03-08 (×6): 650 mg via ORAL
  Filled 2023-03-05 (×6): qty 2

## 2023-03-05 NOTE — H&P (Signed)
History and Physical    Holly Ingram FAO:[id number] DOB: 1976-03-30 DOA: 03/05/2023  PCP: [facility]   Patient coming from: Home   Chief Complaint: Right foot pain and swelling   HPI: Holly Ingram is a pleasant 47 y.o. female with medical history significant for chronic anemia, hyperbilirubinemia, and chronic pain who presents emergency department with worsening pain and swelling involving the right foot.  Patient reports that she was in her usual state 3 days ago when she suffered a minor bite on her right foot from her cat.  She has since developed progressive pain and swelling of the right forefoot.  She continues to worsen despite using ice and analgesics at home.  There is not any drainage and she denies subjective fever or chills.  [facility] ED Course: Upon arrival to the ED, patient is found to be afebrile and saturating well on room air with normal heart rate and stable blood pressure.  Labs are most notable for potassium 3.0, AST 78, total bilirubin 2.2, hemoglobin 9.3, and lactic acid 3.0.  CT demonstrates diffuse right foot soft tissue swelling without acute bony abnormality.  Blood cultures were collected and the patient was treated with Rocephin, vancomycin, 1 L of LR, Tdap, Toradol, Dilaudid, and potassium in the ED.  She was transferred to [facility] admission.  Review of Systems:  All other systems reviewed and apart from HPI, are negative.  History reviewed. No pertinent past medical history.  History reviewed. No pertinent surgical history.  Social History:   reports that she has been smoking cigarettes. She started smoking about 36 years ago. She has a 36.6 pack-year smoking history. She has never used smokeless tobacco. She reports current alcohol use. She reports that she does not use drugs.  Allergies  Allergen Reactions   Morphine Rash    History reviewed. No pertinent family history.   Prior to Admission medications    Medication Sig Start Date End Date Taking? Authorizing Provider  Ascorbic Acid (VITAMIN C CR) 1500 MG TBCR Take 1,500 mg by mouth daily.   Yes [provider]  gabapentin (NEURONTIN) 100 MG capsule Take 100 mg by mouth 3 (three) times daily. 10/11/22  Yes [provider]  ibuprofen (ADVIL) 200 MG tablet Take 1,000 mg by mouth daily as needed (pain).   Yes [provider]  Menthol, Topical Analgesic, (BIOFREEZE EX) Apply 1 Application topically in the morning, at noon, in the evening, and at bedtime.   Yes [provider]  methocarbamol (ROBAXIN) 500 MG tablet Take 500 mg by mouth 2 (two) times daily as needed. 10/11/22  Yes [provider]  Multiple Vitamins-Minerals (MULTIVITAMIN WITH MINERALS) tablet Take 1 tablet by mouth daily.   Yes [provider]  Polyvinyl Alcohol-Povidone (CLEAR EYES ALL SEASONS OP) Place 2 drops into both eyes in the morning and at bedtime.   Yes [provider]  potassium chloride SA (KLOR-CON M) 20 MEQ tablet Take 1 tablet (20 mEq total) by mouth daily for 5 days. 05/22/22 03/05/23 Yes Jerald Kief, MD    Physical Exam: Vitals:   03/05/23 1900 03/05/23 1920 03/05/23 1930 03/05/23 2032  BP: (!) 163/94  (!) 153/73 (!) 163/90  Pulse: 78  73 67  Resp: 16  16   Temp:  98.9 F (37.2 C)  98.8 F (37.1 C)  TempSrc:  Oral  Oral  SpO2: 100%  100% 100%  Weight:      Height:  Constitutional: NAD, calm  Eyes: PERTLA, lids and conjunctivae normal ENMT: Mucous membranes are moist. Posterior pharynx clear of any exudate or lesions.   Neck: supple, no masses  Respiratory: no wheezing, no crackles. No accessory muscle use.  Cardiovascular: S1 & S2 heard, regular rate and rhythm. No JVD.   Abdomen: No distension, no tenderness, soft. Bowel sounds active.  Musculoskeletal: no clubbing / cyanosis. No joint deformity upper and lower extremities.   Skin: Faint erythema, edema, heat, and tenderness  involving dorsal right forefoot. Warm, dry, well-perfused. Neurologic: CN 2-12 grossly intact. Moving all extremities. Alert and oriented.  Psychiatric: Pleasant. Cooperative.    Labs and Imaging on Admission: I have personally reviewed following labs and imaging studies  CBC: Recent Labs  Lab 03/05/23 1228  WBC 5.1  NEUTROABS 3.5  HGB 9.3*  HCT 25.8*  MCV 136.5*  PLT 169   Basic Metabolic Panel: Recent Labs  Lab 03/05/23 1228  NA 139  K 3.0*  CL 102  CO2 27  GLUCOSE 94  BUN <5*  CREATININE 0.52  CALCIUM 9.1   GFR: Estimated Creatinine Clearance: 68.8 mL/min (by C-G formula based on SCr of 0.52 mg/dL). Liver Function Tests: Recent Labs  Lab 03/05/23 1228  AST 78*  ALT 40  ALKPHOS 114  BILITOT 2.2*  PROT 6.8  ALBUMIN 4.2   No results for input(s): "LIPASE", "AMYLASE" in the last 168 hours. No results for input(s): "AMMONIA" in the last 168 hours. Coagulation Profile: Recent Labs  Lab 03/05/23 1228  INR 1.0   Cardiac Enzymes: No results for input(s): "CKTOTAL", "CKMB", "CKMBINDEX", "TROPONINI" in the last 168 hours. BNP (last 3 results) No results for input(s): "PROBNP" in the last 8760 hours. HbA1C: No results for input(s): "HGBA1C" in the last 72 hours. CBG: No results for input(s): "GLUCAP" in the last 168 hours. Lipid Profile: No results for input(s): "CHOL", "HDL", "LDLCALC", "TRIG", "CHOLHDL", "LDLDIRECT" in the last 72 hours. Thyroid Function Tests: No results for input(s): "TSH", "T4TOTAL", "FREET4", "T3FREE", "THYROIDAB" in the last 72 hours. Anemia Panel: No results for input(s): "VITAMINB12", "FOLATE", "FERRITIN", "TIBC", "IRON", "RETICCTPCT" in the last 72 hours. Urine analysis:    Component Value Date/Time   COLORURINE YELLOW 03/05/2023 1540   APPEARANCEUR CLEAR 03/05/2023 1540   LABSPEC 1.010 03/05/2023 1540   PHURINE 8.0 03/05/2023 1540   GLUCOSEU NEGATIVE 03/05/2023 1540   HGBUR NEGATIVE 03/05/2023 1540   BILIRUBINUR NEGATIVE  03/05/2023 1540   KETONESUR NEGATIVE 03/05/2023 1540   PROTEINUR TRACE (A) 03/05/2023 1540   NITRITE NEGATIVE 03/05/2023 1540   LEUKOCYTESUR SMALL (A) 03/05/2023 1540   Sepsis Labs: @LABRCNTIP (procalcitonin:4,lacticidven:4) )No results found for this or any previous visit (from the past 240 hour(s)).   Radiological Exams on Admission: CT FOOT RIGHT W CONTRAST  Result Date: 03/05/2023 CLINICAL DATA:  Right foot pain, swelling EXAM: CT OF THE LOWER RIGHT EXTREMITY WITH CONTRAST TECHNIQUE: Multidetector CT imaging of the lower right extremity was performed according to the standard protocol following intravenous contrast administration. RADIATION DOSE REDUCTION: This exam was performed according to the departmental dose-optimization program which includes automated exposure control, adjustment of the mA and/or kV according to patient size and/or use of iterative reconstruction technique. CONTRAST:  75mL OMNIPAQUE IOHEXOL 300 MG/ML  SOLN COMPARISON:  Plain films today FINDINGS: Bones/Joint/Cartilage No acute bony abnormality. Specifically, no fracture, subluxation, or dislocation. No bone destruction to suggest osteomyelitis. Ligaments Suboptimally assessed by CT. Muscles and Tendons Unremarkable Soft tissues Diffuse soft tissue swelling. IMPRESSION: Diffuse soft tissue swelling.  No acute bony abnormality or evidence of osteomyelitis. Electronically Signed   By: Charlett Nose M.D.   On: 03/05/2023 19:20   DG Foot Complete Right  Result Date: 03/05/2023 CLINICAL DATA:  Right foot pain and swelling for 3 days. No known trauma. History of cellulitis. EXAM: RIGHT FOOT COMPLETE - 3 VIEW COMPARISON:  None Available. FINDINGS: There is no evidence of fracture or dislocation. There is no evidence of arthropathy or other focal bone abnormality. Diffuse soft tissue swelling. IMPRESSION: 1. Diffuse soft tissue swelling. 2. No acute osseous abnormality. Electronically Signed   By: Jacob Moores M.D.   On:  03/05/2023 13:59     Assessment/Plan   1. Right foot cellulitis; cat bite - Pt believes this began with a cat bite  - Not septic on admission; no gas, fluid-collection, foreign body, or osteomyelitis noted on CT  - Treat with Unasyn, follow cultures and clinical course, consider repeat imaging in worsens or fails to improve as expected    2. Hypokalemia  - Replaced in ED, will repeat chem panel in am    3. Elevated LFTs  - Chronic, appears stable   - She had unremarkable workup in 2021 and was referred to GI    4. Macrocytic anemia  - Appears stable with no overt bleeding    DVT prophylaxis: Lovenox  Code Status: Full  Level of Care: Level of care: Med-Surg Family Communication: none present  Disposition Plan:  Patient is from: home  Anticipated d/c is to: home  Anticipated d/c date is: 03/07/23  Patient currently: Pending improvement in cellulitis  Consults called: None  Admission status: Inpatient     Briscoe Deutscher, MD [facility]  03/05/2023, 8:51 PM

## 2023-03-05 NOTE — Progress Notes (Signed)
Pharmacy Antibiotic Note  Holly Ingram is a 47 y.o. female admitted on 03/05/2023 with  swollen right foot .  Pharmacy has been consulted for vancomycin dosing. Pt presented w/ worsening right foot pain, redness, and swelling x2 days after scratched/bitten by cat. Afebrile. WBC 5.1. LA 3.3. Scr 0.52.  Plan: Vancomycin 1250 mg IV every 24 hours (eAUC 519.2, Css max 41.4, Css min 10.4) Ceftriaxone 1 g IV every 24 hours Monitor daily Scr, CBC, temp, and for clinical signs of improvement  F/u cultures and de-escalate antibiotics as indicated  Height: 5\' 2"  (157.5 cm) Weight: 54.4 kg (120 lb) IBW/kg (Calculated) : 50.1  Temp (24hrs), Avg:98.4 F (36.9 C), Min:98.2 F (36.8 C), Max:98.7 F (37.1 C)  Recent Labs  Lab 03/05/23 1228 03/05/23 1428  WBC 5.1  --   CREATININE 0.52  --   LATICACIDVEN 3.0* 3.3*    Estimated Creatinine Clearance: 68.8 mL/min (by C-G formula based on SCr of 0.52 mg/dL).    Allergies  Allergen Reactions   Morphine Rash    Antimicrobials this admission: Ceftriaxone 7/23 >> 7/30 Vancomycin 7/23 >>   Microbiology results: 7/23 BCx: ip  Thank you for allowing pharmacy to be a part of this patient's care.  Laqueta Jean PharmD Candidate 03/05/2023 6:53 PM

## 2023-03-05 NOTE — ED Provider Notes (Signed)
[facility] In[facility] EMERGENCY DEPARTMENT AT [facility] Provider Note   CSN: [id number] Arrival date & time: 03/05/23  1218     History  Chief Complaint  Patient presents with   Foot Pain    Holly Ingram is a 47 y.o. female presented with right foot pain for the past 2 days.  Patient states she has plantar And notes that her cat does tend scratch and bite her often but noticed after playing with her cat she began having right foot pain.  Over the past 2 days the pain has become more progressive along with redness and swelling of her right foot.  Patient states that she is still feel her toes however does not want to move her foot at all due to progressing pain.  Patient denies any fevers or being able to see any wounds.  Patient denies any nausea/vomiting, abdominal pain, chest pain, shortness of breath.  Home Medications Prior to Admission medications   Medication Sig Start Date End Date Taking? Authorizing Provider  Ascorbic Acid (VITAMIN C CR) 1500 MG TBCR Take 1,500 mg by mouth daily.   Yes [provider]  gabapentin (NEURONTIN) 100 MG capsule Take 100 mg by mouth 3 (three) times daily. 10/11/22  Yes [provider]  ibuprofen (ADVIL) 200 MG tablet Take 1,000 mg by mouth daily as needed (pain).   Yes [provider]  Menthol, Topical Analgesic, (BIOFREEZE EX) Apply 1 Application topically in the morning, at noon, in the evening, and at bedtime.   Yes [provider]  methocarbamol (ROBAXIN) 500 MG tablet Take 500 mg by mouth 2 (two) times daily as needed. 10/11/22  Yes [provider]  Multiple Vitamins-Minerals (MULTIVITAMIN WITH MINERALS) tablet Take 1 tablet by mouth daily.   Yes [provider]  Polyvinyl Alcohol-Povidone (CLEAR EYES ALL SEASONS OP) Place 2 drops into both eyes in the morning and at bedtime.   Yes [provider]  potassium chloride SA (KLOR-CON M) 20 MEQ tablet Take 1 tablet (20 mEq total) by mouth  daily for 5 days. 05/22/22 03/05/23 Yes Jerald Kief, MD  cyclobenzaprine (FLEXERIL) 5 MG tablet Take 1 tablet (5 mg total) by mouth at bedtime as needed for muscle spasms. Patient not taking: Reported on 03/05/2023 05/22/22   Jerald Kief, MD  fluconazole (DIFLUCAN) 150 MG tablet Take 1 tablet (150 mg total) by mouth as needed (for yeast infection). Patient not taking: Reported on 03/05/2023 05/22/22   Jerald Kief, MD      Allergies    Morphine    Review of Systems   Review of Systems See HPI Physical Exam Updated Vital Signs BP (!) 154/107 (BP Location: Right Arm)   Pulse 78   Temp 98.7 F (37.1 C) (Oral)   Resp 16   Ht 5\' 2"  (1.575 m)   Wt 54.4 kg   SpO2 100%   BMI 21.95 kg/m  Physical Exam Constitutional:      Comments: Obvious discomfort  Cardiovascular:     Rate and Rhythm: Normal rate.     Pulses: Normal pulses.  Musculoskeletal:     Comments: Right foot: Erythematous and edematous, warm to the touch, no step-off/crepitus/ecchymosis palpated, unwilling to range ankle or toes due to pain Soft compartments  Skin:    General: Skin is warm and dry.     Capillary Refill: Capillary refill takes less than 2 seconds.  Neurological:     Mental Status: She is alert.     Comments: Sensation  intact distally     ED Results / Procedures / Treatments   Labs (all labs ordered are listed, but only abnormal results are displayed) Labs Reviewed  CBC WITH DIFFERENTIAL/PLATELET - Abnormal; Notable for the following components:      Result Value   RBC 1.89 (*)    Hemoglobin 9.3 (*)    HCT 25.8 (*)    MCV 136.5 (*)    MCH 49.2 (*)    All other components within normal limits  COMPREHENSIVE METABOLIC PANEL - Abnormal; Notable for the following components:   Potassium 3.0 (*)    BUN <5 (*)    AST 78 (*)    Total Bilirubin 2.2 (*)    All other components within normal limits  LACTIC ACID, PLASMA - Abnormal; Notable for the following components:   Lactic Acid, Venous  3.0 (*)    All other components within normal limits  LACTIC ACID, PLASMA - Abnormal; Notable for the following components:   Lactic Acid, Venous 3.3 (*)    All other components within normal limits  URINALYSIS, ROUTINE W REFLEX MICROSCOPIC - Abnormal; Notable for the following components:   Protein, ur TRACE (*)    Leukocytes,Ua SMALL (*)    Bacteria, UA RARE (*)    All other components within normal limits  CULTURE, BLOOD (ROUTINE X 2)  CULTURE, BLOOD (ROUTINE X 2)  PROTIME-INR  SEDIMENTATION RATE  C-REACTIVE PROTEIN    EKG None  Radiology DG Foot Complete Right  Result Date: 03/05/2023 CLINICAL DATA:  Right foot pain and swelling for 3 days. No known trauma. History of cellulitis. EXAM: RIGHT FOOT COMPLETE - 3 VIEW COMPARISON:  None Available. FINDINGS: There is no evidence of fracture or dislocation. There is no evidence of arthropathy or other focal bone abnormality. Diffuse soft tissue swelling. IMPRESSION: 1. Diffuse soft tissue swelling. 2. No acute osseous abnormality. Electronically Signed   By: Jacob Moores M.D.   On: 03/05/2023 13:59    Procedures .Critical Care  Performed by: Netta Corrigan, PA-C Authorized by: Netta Corrigan, PA-C   Critical care provider statement:    Critical care time (minutes):  30   Critical care time was exclusive of:  Separately billable procedures and treating other patients   Critical care was necessary to treat or prevent imminent or life-threatening deterioration of the following conditions: lactic acid, repeat opioids.   Critical care was time spent personally by me on the following activities:  Blood draw for specimens, development of treatment plan with patient or surrogate, discussions with consultants, evaluation of patient's response to treatment, examination of patient, obtaining history from patient or surrogate, review of old charts, re-evaluation of patient's condition, pulse oximetry, ordering and review of radiographic  studies, ordering and review of laboratory studies and ordering and performing treatments and interventions   I assumed direction of critical care for this patient from another provider in my specialty: no     Care discussed with: admitting provider       Medications Ordered in ED Medications  cefTRIAXone (ROCEPHIN) 1 g in sodium chloride 0.9 % 100 mL IVPB (0 g Intravenous Stopped 03/05/23 1642)  0.9 %  sodium chloride infusion (has no administration in time range)  ketorolac (TORADOL) 15 MG/ML injection 15 mg (has no administration in time range)  HYDROmorphone (DILAUDID) injection 1 mg (1 mg Intravenous Given 03/05/23 1325)  lactated ringers bolus 1,000 mL (0 mLs Intravenous Stopped 03/05/23 1444)  Tdap (BOOSTRIX) injection 0.5 mL (0.5 mLs Intramuscular  Given 03/05/23 1514)  potassium chloride SA (KLOR-CON M) CR tablet 40 mEq (40 mEq Oral Given 03/05/23 1452)  HYDROmorphone (DILAUDID) injection 0.5 mg (0.5 mg Intravenous Given 03/05/23 1611)    ED Course/ Medical Decision Making/ A&P                             Medical Decision Making Amount and/or Complexity of Data Reviewed Labs: ordered. Radiology: ordered.  Risk Prescription drug management.   Sundy Houchins 47 y.o. presented today for right foot pain. Working DDx that I considered at this time includes, but not limited to, cellulitis, trauma, occult fracture, sepsis, compartment syndrome, neurovascular compromise.  R/o DDx: cellulitis, trauma, occult fracture, sepsis, compartment syndrome, neurovascular compromise: These are considered less likely due to history of present illness and physical exam findings  Review of prior external notes: 05/22/2022 discharge summary  Unique Tests and My Interpretation:  CBC: Unremarkable CMP: Hypokalemia 3.0 Lactic acid: 3, 3.3 PT/INR: Unremarkable UA: Unremarkable ESR: Unremarkable CRP: Pending Foot x-ray: Edema noted but no acute osseous changes  Discussion with Independent  Historian:  Daughter  Discussion of Management of Tests:  Samtani, MD Hospitalist  Risk: High: hospitalization or escalation of hospital-level care  Risk Stratification Score: none  Staffed with Deretha Emory, MD  Plan: On exam patient was in distress stable vitals.  Patient's right foot appears to be edematous and erythematous and warm to the touch however patient does have good pulses.  Patient unable to range her right ankle or toes due to pain.  Patient still sensation distally.  Patient states this happened the past due to her Biting her and states that her symptoms began after she was done playing with a cat but is unsure if her cat scratched or bit her.  Patient states that cats up-to-date on vaccines.  X-rays ordered along with labs including ESR and CRP.  Lactic acid came back elevated 3.0 and so patient will be started on lactated Ringer's.  Patient's potassium was also 3.0 and will be replenished.  Patient's Tdap was also updated in case she has been bitten by her cat again however I was unable to see any bite marks or scratch marks on patient's foot as patient states this is very similar back in October when her cat bit her.  The attending evaluated the patient and stated that the foot appears to be swollen due to a traumatic cause versus infectious cause.  Patient's lab workup to this point has been reassuring in terms of infection as she does not have elevated ESR or white count however still awaiting CRP.  Patient's x-ray was reassuring but at the attending's request to Rocephin will be ordered.  On recheck patient stated that she was still having pain after receiving Dilaudid and will be given another round of Dilaudid.  Pending patient's delta lactic after receiving LR patient may be discharged.  If lactic acid still elevated may consider admission.  Lactic acid came back elevated at 3.3.  Hospitalist will be consulted in anticipation for admission.  Unsure of the source at this  time.  I spoke to the hospitalist and hospitalist recommended infusion of fluids, vancomycin, and CT right foot with contrast.  Patient was accepted for admission and stable for admission at this time.         Final Clinical Impression(s) / ED Diagnoses Final diagnoses:  Elevated lactic acid level  Edema of right foot    Rx / DC  Orders ED Discharge Orders     None         Remi Deter 03/05/23 1646    Vanetta Mulders, MD 03/06/23 [phone]

## 2023-03-05 NOTE — Progress Notes (Deleted)
I discussed / reviewed the pharmacy note by the pharmacy student below, and I agree with the findings and plans as documented.  Delmar Landau, PharmD, BCPS 03/05/2023 7:35 PM ED Clinical Pharmacist -  [phone]

## 2023-03-05 NOTE — ED Triage Notes (Signed)
Patient c/o right foot pain and swelling x 3 days.  Patient reports no trauma, but reports her cats do bite and scratch her foot.  Patient has a history of cellulitis with sepsis and thinks it's the same.

## 2023-03-05 NOTE — ED Notes (Signed)
Holly Ingram with cl called for transport

## 2023-03-05 NOTE — ED Notes (Signed)
Report given to the floor RN.

## 2023-03-05 NOTE — ED Notes (Addendum)
One set of blood cultures drawn from left AC iv.

## 2023-03-05 NOTE — ED Notes (Signed)
Pt aware of the need for a urine... Unable to currently collect sample.Marland KitchenMarland Kitchen

## 2023-03-05 NOTE — ED Notes (Signed)
Report given to [facility] 

## 2023-03-06 DIAGNOSIS — R7989 Other specified abnormal findings of blood chemistry: Secondary | ICD-10-CM | POA: Diagnosis not present

## 2023-03-06 DIAGNOSIS — M7989 Other specified soft tissue disorders: Secondary | ICD-10-CM

## 2023-03-06 DIAGNOSIS — R600 Localized edema: Secondary | ICD-10-CM | POA: Diagnosis not present

## 2023-03-06 LAB — COMPREHENSIVE METABOLIC PANEL
ALT: 36 U/L (ref 0–44)
AST: 61 U/L — ABNORMAL HIGH (ref 15–41)
Albumin: 3.5 g/dL (ref 3.5–5.0)
Alkaline Phosphatase: 97 U/L (ref 38–126)
Anion gap: 9 (ref 5–15)
BUN: <5 mg/dL — ABNORMAL LOW (ref 6–20)
CO2: 23 mmol/L (ref 22–32)
Calcium: 8.1 mg/dL — ABNORMAL LOW (ref 8.9–10.3)
Chloride: 104 mmol/L (ref 98–111)
Creatinine, Ser: 0.53 mg/dL (ref 0.44–1.00)
GFR, Estimated: >60 mL/min (ref >60)
Glucose, Bld: 94 mg/dL (ref 70–99)
Potassium: 3.1 mmol/L — ABNORMAL LOW (ref 3.5–5.1)
Sodium: 136 mmol/L (ref 135–145)
Total Bilirubin: 2 mg/dL — ABNORMAL HIGH (ref 0.3–1.2)
Total Protein: 6.1 g/dL — ABNORMAL LOW (ref 6.5–8.1)

## 2023-03-06 LAB — CULTURE, BLOOD (ROUTINE X 2): Culture: NO GROWTH

## 2023-03-06 LAB — CK: Total CK: 40 U/L (ref 38–234)

## 2023-03-06 LAB — CBC
HCT: 26.4 % — ABNORMAL LOW (ref 36.0–46.0)
Hemoglobin: 9 g/dL — ABNORMAL LOW (ref 12.0–15.0)
MCH: 48.6 pg — ABNORMAL HIGH (ref 26.0–34.0)
MCHC: 34.1 g/dL (ref 30.0–36.0)
MCV: 142.7 fL — ABNORMAL HIGH (ref 80.0–100.0)
Platelets: 142 10*3/uL — ABNORMAL LOW (ref 150–400)
RBC: 1.85 MIL/uL — ABNORMAL LOW (ref 3.87–5.11)
RDW: 15.2 % (ref 11.5–15.5)
WBC: 4.8 10*3/uL (ref 4.0–10.5)
nRBC: 0 % (ref 0.0–0.2)

## 2023-03-06 LAB — MAGNESIUM: Magnesium: 1.6 mg/dL — ABNORMAL LOW (ref 1.7–2.4)

## 2023-03-06 LAB — HIV ANTIBODY (ROUTINE TESTING W REFLEX): HIV Screen 4th Generation wRfx: NONREACTIVE

## 2023-03-06 LAB — VITAMIN B12: Vitamin B-12: 189 pg/mL (ref 180–914)

## 2023-03-06 LAB — URIC ACID: Uric Acid, Serum: 6.1 mg/dL (ref 2.5–7.1)

## 2023-03-06 MED ORDER — TRAZODONE HCL 50 MG PO TABS
50.0000 mg | ORAL_TABLET | Freq: Every day | ORAL | Status: DC
  Administered 2023-03-06: 50 mg via ORAL
  Filled 2023-03-06: qty 1

## 2023-03-06 MED ORDER — NICOTINE 14 MG/24HR TD PT24
14.0000 mg | MEDICATED_PATCH | Freq: Every day | TRANSDERMAL | Status: DC
  Administered 2023-03-06 – 2023-03-08 (×3): 14 mg via TRANSDERMAL
  Filled 2023-03-06 (×3): qty 1

## 2023-03-06 MED ORDER — IBUPROFEN 400 MG PO TABS: 400.0000 mg | ORAL_TABLET | Freq: Four times a day (QID) | ORAL | Status: DC | PRN

## 2023-03-06 MED ORDER — PANTOPRAZOLE SODIUM 40 MG PO TBEC
40.0000 mg | DELAYED_RELEASE_TABLET | Freq: Two times a day (BID) | ORAL | Status: DC
  Administered 2023-03-06 – 2023-03-08 (×5): 40 mg via ORAL
  Filled 2023-03-06 (×5): qty 1

## 2023-03-06 MED ORDER — CYANOCOBALAMIN 1000 MCG/ML IJ SOLN
1000.0000 ug | Freq: Every day | INTRAMUSCULAR | Status: DC
  Administered 2023-03-06 – 2023-03-08 (×3): 1000 ug via SUBCUTANEOUS
  Filled 2023-03-06 (×3): qty 1

## 2023-03-06 MED ORDER — NAPROXEN 250 MG PO TABS
500.0000 mg | ORAL_TABLET | Freq: Two times a day (BID) | ORAL | Status: DC
  Administered 2023-03-06 – 2023-03-08 (×5): 500 mg via ORAL
  Filled 2023-03-06 (×5): qty 2

## 2023-03-06 MED ORDER — POTASSIUM CHLORIDE CRYS ER 20 MEQ PO TBCR
40.0000 meq | EXTENDED_RELEASE_TABLET | Freq: Two times a day (BID) | ORAL | Status: AC
  Administered 2023-03-06 (×2): 40 meq via ORAL
  Filled 2023-03-06 (×2): qty 2

## 2023-03-06 MED ORDER — IBUPROFEN 400 MG PO TABS: 600.0000 mg | ORAL_TABLET | Freq: Four times a day (QID) | ORAL | Status: DC

## 2023-03-06 MED ORDER — FOLIC ACID 1 MG PO TABS
1.0000 mg | ORAL_TABLET | Freq: Every day | ORAL | Status: DC
  Administered 2023-03-06 – 2023-03-08 (×3): 1 mg via ORAL
  Filled 2023-03-06 (×3): qty 1

## 2023-03-06 MED ORDER — CYANOCOBALAMIN 1000 MCG/ML IJ SOLN: 1000.0000 ug | Freq: Every day | INTRAMUSCULAR | Status: DC

## 2023-03-06 NOTE — Progress Notes (Signed)
rm 1315, [facility], Pt Holly Ingram Lactic Acid 2.6 now came down from 3.3

## 2023-03-06 NOTE — Progress Notes (Signed)
   03/06/23 1321  TOC Brief Assessment  Insurance and Status Reviewed  Patient has primary care physician Yes  Home environment has been reviewed Resides with significant other  Prior level of function: Independent at baseline  Prior/Current Home Services No current home services  Social Determinants of Health Reivew SDOH reviewed no interventions necessary  Readmission risk has been reviewed Yes  Transition of care needs no transition of care needs at this time

## 2023-03-06 NOTE — Progress Notes (Addendum)
[facility] PROGRESS NOTE    Progress Note  Holly Ingram  JWJ:[id number] DOB: Sep 18, 1975 DOA: 03/05/2023 PCP: [facility]     Brief Narrative:   Holly Ingram is an 47 y.o. female past medical history significant for chronic anemia, hyperbilirubinemia comes into the emergency room for worsening swelling and erythema of the right foot that started 3 days prior to admission.     Assessment/Plan:   Right foot pain and swelling: CT scan showed no fluid collection foreign bodies or osteomyelitis.  This showed diffuse soft tissue swelling no joint effusion Continue on IV Unasyn.  He has remained afebrile with no leukocytosis. Blood cultures have been sent. This seems more like gout she can put a sock on exquisitely tender to examination even by touching the dorsum of the foot she screams in pain. There is no single joint effusion and created on CT, I do not know if it is amenable for arthrocentesis we will treat empirically, for an acute gout flare Will go ahead and start her on naproxen twice a day for possible gout. Check a uric acid level. Started on Protonix p.o. twice daily for GI prophylaxis. She relates narcotics are not working for pain.  Hypokalemia: Replete orally recheck in the morning.  Elevated LFTs: Check a CK, they are trending down likely due to infectious etiology.  Macrocytic anemia: Back in 2023 she was diagnosed with low B12 and folate. Recheck B12 and folate. Start B12 and folate repletion.    DVT prophylaxis: lovenox Family Communication:none Status is: Inpatient Remains inpatient appropriate because: Acute soft tissue swelling    Code Status:     Code Status Orders  (From admission, onward)           Start     Ordered   03/05/23 2051  Full code  Continuous       Question:  By:  Answer:  Consent: discussion documented in EHR   03/05/23 2051           Code Status History     Date Active Date Inactive Code Status  Order ID Comments User Context   05/20/2022 1929 05/22/2022 1745 Full Code [id number]  Anselm Jungling, DO Inpatient   03/18/2022 2337 03/19/2022 0902 Full Code [id number]  Lurline Del, MD ED         IV Access:   Peripheral IV   Procedures and diagnostic studies:   CT FOOT RIGHT W CONTRAST  Result Date: 03/05/2023 CLINICAL DATA:  Right foot pain, swelling EXAM: CT OF THE LOWER RIGHT EXTREMITY WITH CONTRAST TECHNIQUE: Multidetector CT imaging of the lower right extremity was performed according to the standard protocol following intravenous contrast administration. RADIATION DOSE REDUCTION: This exam was performed according to the departmental dose-optimization program which includes automated exposure control, adjustment of the mA and/or kV according to patient size and/or use of iterative reconstruction technique. CONTRAST:  75mL OMNIPAQUE IOHEXOL 300 MG/ML  SOLN COMPARISON:  Plain films today FINDINGS: Bones/Joint/Cartilage No acute bony abnormality. Specifically, no fracture, subluxation, or dislocation. No bone destruction to suggest osteomyelitis. Ligaments Suboptimally assessed by CT. Muscles and Tendons Unremarkable Soft tissues Diffuse soft tissue swelling. IMPRESSION: Diffuse soft tissue swelling. No acute bony abnormality or evidence of osteomyelitis. Electronically Signed   By: Charlett Nose M.D.   On: 03/05/2023 19:20   DG Foot Complete Right  Result Date: 03/05/2023 CLINICAL DATA:  Right foot pain and swelling for 3 days. No known trauma. History of cellulitis. EXAM: RIGHT FOOT COMPLETE -  3 VIEW COMPARISON:  None Available. FINDINGS: There is no evidence of fracture or dislocation. There is no evidence of arthropathy or other focal bone abnormality. Diffuse soft tissue swelling. IMPRESSION: 1. Diffuse soft tissue swelling. 2. No acute osseous abnormality. Electronically Signed   By: Jacob Moores M.D.   On: 03/05/2023 13:59     Medical Consultants:   None.   Subjective:     [name] exquisitely tender to palpation  Objective:    Vitals:   03/05/23 2032 03/05/23 2202 03/06/23 0201 03/06/23 0534  BP: (!) 163/90 (!) 154/94 130/78 (!) 152/77  Pulse: 67 71 70 (!) 58  Resp: 17 17 18 17   Temp: 98.8 F (37.1 C) 98.6 F (37 C) 98.7 F (37.1 C) 97.7 F (36.5 C)  TempSrc: Oral Oral Oral Oral  SpO2: 100% 99% 97% 95%  Weight:      Height:       SpO2: 95 %   Intake/Output Summary (Last 24 hours) at 03/06/2023 0754 Last data filed at 03/06/2023 0600 Gross per 24 hour  Intake 1830 ml  Output 0 ml  Net 1830 ml   Filed Weights   03/05/23 1225  Weight: 54.4 kg    Exam: General exam: In no acute distress. Respiratory system: Good air movement and clear to auscultation. Cardiovascular system: S1 & S2 heard, RRR. No JV.  Gastrointestinal system: Abdomen is nondistended, soft and nontender.  Extremities: Right foot is asked mainly swelling exquisitely tender to palpation hard to examine it due to the pain even by touching the skin with the sheets causes pain Skin: No erythema Psychiatry: Judgement and insight appear normal. Mood & affect appropriate.    Data Reviewed:    Labs: Basic Metabolic Panel: Recent Labs  Lab 03/05/23 1228 03/06/23 0454  NA 139 136  K 3.0* 3.1*  CL 102 104  CO2 27 23  GLUCOSE 94 94  BUN <5* <5*  CREATININE 0.52 0.53  CALCIUM 9.1 8.1*  MG  --  1.6*   GFR Estimated Creatinine Clearance: 68.8 mL/min (by C-G formula based on SCr of 0.53 mg/dL). Liver Function Tests: Recent Labs  Lab 03/05/23 1228 03/06/23 0454  AST 78* 61*  ALT 40 36  ALKPHOS 114 97  BILITOT 2.2* 2.0*  PROT 6.8 6.1*  ALBUMIN 4.2 3.5   No results for input(s): "LIPASE", "AMYLASE" in the last 168 hours. No results for input(s): "AMMONIA" in the last 168 hours. Coagulation profile Recent Labs  Lab 03/05/23 1228  INR 1.0   COVID-19 Labs  Recent Labs    03/05/23 1313  CRP 0.8    No results found for:  "SARSCOV2NAA"  CBC: Recent Labs  Lab 03/05/23 1228 03/06/23 0454  WBC 5.1 4.8  NEUTROABS 3.5  --   HGB 9.3* 9.0*  HCT 25.8* 26.4*  MCV 136.5* 142.7*  PLT 169 142*   Cardiac Enzymes: No results for input(s): "CKTOTAL", "CKMB", "CKMBINDEX", "TROPONINI" in the last 168 hours. BNP (last 3 results) No results for input(s): "PROBNP" in the last 8760 hours. CBG: No results for input(s): "GLUCAP" in the last 168 hours. D-Dimer: No results for input(s): "DDIMER" in the last 72 hours. Hgb A1c: No results for input(s): "HGBA1C" in the last 72 hours. Lipid Profile: No results for input(s): "CHOL", "HDL", "LDLCALC", "TRIG", "CHOLHDL", "LDLDIRECT" in the last 72 hours. Thyroid function studies: No results for input(s): "TSH", "T4TOTAL", "T3FREE", "THYROIDAB" in the last 72 hours.  Invalid input(s): "FREET3" Anemia work up: No results for input(s): "VITAMINB12", "  FOLATE", "FERRITIN", "TIBC", "IRON", "RETICCTPCT" in the last 72 hours. Sepsis Labs: Recent Labs  Lab 03/05/23 1228 03/05/23 1428 03/05/23 2237 03/06/23 0454  WBC 5.1  --   --  4.8  LATICACIDVEN 3.0* 3.3* 2.6*  --    Microbiology No results found for this or any previous visit (from the past 240 hour(s)).   Medications:    enoxaparin (LOVENOX) injection  40 mg Subcutaneous Q24H   gabapentin  100 mg Oral TID   Continuous Infusions:  ampicillin-sulbactam (UNASYN) IV 3 g (03/06/23 0409)      LOS: 1 day   Marinda Elk  [facility]  03/06/2023, 7:54 AM

## 2023-03-07 DIAGNOSIS — R7989 Other specified abnormal findings of blood chemistry: Secondary | ICD-10-CM | POA: Diagnosis not present

## 2023-03-07 DIAGNOSIS — D696 Thrombocytopenia, unspecified: Secondary | ICD-10-CM | POA: Insufficient documentation

## 2023-03-07 DIAGNOSIS — M7989 Other specified soft tissue disorders: Secondary | ICD-10-CM | POA: Diagnosis not present

## 2023-03-07 DIAGNOSIS — D539 Nutritional anemia, unspecified: Secondary | ICD-10-CM | POA: Diagnosis not present

## 2023-03-07 DIAGNOSIS — R600 Localized edema: Secondary | ICD-10-CM | POA: Diagnosis not present

## 2023-03-07 DIAGNOSIS — D72819 Decreased white blood cell count, unspecified: Secondary | ICD-10-CM | POA: Insufficient documentation

## 2023-03-07 LAB — HEPATITIS PANEL, ACUTE
HCV Ab: NONREACTIVE
Hep A IgM: NONREACTIVE
Hep B C IgM: NONREACTIVE
Hepatitis B Surface Ag: NONREACTIVE

## 2023-03-07 LAB — COMPREHENSIVE METABOLIC PANEL
ALT: 34 U/L (ref 0–44)
AST: 73 U/L — ABNORMAL HIGH (ref 15–41)
Albumin: 3.1 g/dL — ABNORMAL LOW (ref 3.5–5.0)
Alkaline Phosphatase: 86 U/L (ref 38–126)
Anion gap: 7 (ref 5–15)
BUN: <5 mg/dL — ABNORMAL LOW (ref 6–20)
Calcium: 8.2 mg/dL — ABNORMAL LOW (ref 8.9–10.3)
Creatinine, Ser: 0.47 mg/dL (ref 0.44–1.00)
GFR, Estimated: >60 mL/min (ref >60)
Glucose, Bld: 85 mg/dL (ref 70–99)
Potassium: 3.9 mmol/L (ref 3.5–5.1)
Sodium: 140 mmol/L (ref 135–145)
Total Bilirubin: 1.7 mg/dL — ABNORMAL HIGH (ref 0.3–1.2)
Total Protein: 5.5 g/dL — ABNORMAL LOW (ref 6.5–8.1)

## 2023-03-07 LAB — CBC
HCT: 23.6 % — ABNORMAL LOW (ref 36.0–46.0)
Hemoglobin: 8.2 g/dL — ABNORMAL LOW (ref 12.0–15.0)
MCH: 49.7 pg — ABNORMAL HIGH (ref 26.0–34.0)
MCHC: 34.7 g/dL (ref 30.0–36.0)
MCV: 143 fL — ABNORMAL HIGH (ref 80.0–100.0)
RBC: 1.65 MIL/uL — ABNORMAL LOW (ref 3.87–5.11)
WBC: 2.9 10*3/uL — ABNORMAL LOW (ref 4.0–10.5)
nRBC: 0.7 % — ABNORMAL HIGH (ref 0.0–0.2)

## 2023-03-07 LAB — FOLATE RBC
Folate, RBC: 819 ng/mL (ref >498)
Hematocrit: 22.6 % — ABNORMAL LOW (ref 34.0–46.6)

## 2023-03-07 LAB — CULTURE, BLOOD (ROUTINE X 2)
Culture: NO GROWTH
Special Requests: ADEQUATE

## 2023-03-07 LAB — URIC ACID: Uric Acid, Serum: 6 mg/dL (ref 2.5–7.1)

## 2023-03-07 MED ORDER — DOXYCYCLINE HYCLATE 100 MG PO TABS
100.0000 mg | ORAL_TABLET | Freq: Two times a day (BID) | ORAL | Status: DC
  Administered 2023-03-07 – 2023-03-08 (×3): 100 mg via ORAL
  Filled 2023-03-07 (×3): qty 1

## 2023-03-07 MED ORDER — TRAZODONE HCL 50 MG PO TABS
25.0000 mg | ORAL_TABLET | Freq: Every evening | ORAL | Status: DC | PRN
  Administered 2023-03-07: 25 mg via ORAL
  Filled 2023-03-07 (×2): qty 1

## 2023-03-07 NOTE — Plan of Care (Signed)
  Problem: Activity: Goal: Risk for activity intolerance will decrease Outcome: Progressing   Problem: Nutrition: Goal: Adequate nutrition will be maintained Outcome: Progressing   Problem: Elimination: Goal: Will not experience complications related to bowel motility Outcome: Progressing Goal: Will not experience complications related to urinary retention Outcome: Progressing   Problem: Pain Managment: Goal: General experience of comfort will improve Outcome: Progressing   

## 2023-03-07 NOTE — Progress Notes (Signed)
Mobility Specialist - Progress Note   03/07/23 1429  Mobility  Activity Ambulated with assistance in room  Level of Assistance Contact guard assist, steadying assist  Assistive Device Other (Comment) (knee scooter)  Distance Ambulated (ft) 30 ft  Range of Motion/Exercises Active Assistive  Activity Response Tolerated fair  $Mobility charge 1 Mobility  Mobility Specialist Start Time (ACUTE ONLY) 1410  Mobility Specialist Stop Time (ACUTE ONLY) 1429  Mobility Specialist Time Calculation (min) (ACUTE ONLY) 19 min   Pt was found in bed and agreeable to try mobilizing. Pt able to ambulate in room but limited due to L leg fatiguing. Pt able to do x1 STS but unable to balance self due to putting most weight on L side. At EOS pt was left in bed with all needs met. Call bell in reach and RN notified of session.  Billey Chang Mobility Specialist

## 2023-03-07 NOTE — Progress Notes (Signed)
[facility] PROGRESS NOTE    Progress Note  Holly Ingram  GEX:[id number] DOB: 11-Aug-1976 DOA: 03/05/2023 PCP: [facility]     Brief Narrative:   Holly Ingram is an 47 y.o. female past medical history significant for chronic anemia, hyperbilirubinemia comes into the emergency room for worsening swelling and erythema of the right foot that started 3 days prior to admission.  She relates no trauma or falls.  CT showed no fluid collection fracture or dislocation for her osteomyelitis just diffuse tissue swelling.   Assessment/Plan:   Right foot pain and swelling: Transition to oral doxycycline. Will treat empirically. Blood cultures have been sent.  HIV is nonreactive. Sed rate is 16, PT and INR 13 and 1.0. This seems more like gout she can put a sock on exquisitely tender to examination even by touching the dorsum of the foot she screams in pain. There is no single joint effusion and created on CT, not amenable for arthrocentesis. Will continue to treat empirically for gout she relates the naproxen has helped. Continue naproxen twice a day for possible gout. Check a uric acid level. Started on Protonix p.o. twice daily for GI prophylaxis.  Hypokalemia: Repleted now resolved.  Elevated LFTs: CK is unremarkable, LFTs demonstrate 2-1.  Likely due to heavy alcohol use Send acute hepatitis panel.  Macrocytic anemia: Back in 2023 she was diagnosed with low B12 and folate. Vitamin B12 is 189 continue repletion. Continue folate repletion as well.  Thrombocytopenia: Continue monitor platelet, likely due to alcohol use.  Leukopenia: Related to alcohol use  Heavy alcohol use: She relates she drinks about 5-6 beers daily continue thiamine and folate repletion. No signs of withdrawal.   DVT prophylaxis: lovenox Family Communication:none Status is: Inpatient Remains inpatient appropriate because: Acute soft tissue swelling    Code Status:     Code  Status Orders  (From admission, onward)           Start     Ordered   03/05/23 2051  Full code  Continuous       Question:  By:  Answer:  Consent: discussion documented in EHR   03/05/23 2051           Code Status History     Date Active Date Inactive Code Status Order ID Comments User Context   05/20/2022 1929 05/22/2022 1745 Full Code [id number]  Anselm Jungling, DO Inpatient   03/18/2022 2337 03/19/2022 0902 Full Code [id number]  Lurline Del, MD ED         IV Access:   Peripheral IV   Procedures and diagnostic studies:   CT FOOT RIGHT W CONTRAST  Result Date: 03/05/2023 CLINICAL DATA:  Right foot pain, swelling EXAM: CT OF THE LOWER RIGHT EXTREMITY WITH CONTRAST TECHNIQUE: Multidetector CT imaging of the lower right extremity was performed according to the standard protocol following intravenous contrast administration. RADIATION DOSE REDUCTION: This exam was performed according to the departmental dose-optimization program which includes automated exposure control, adjustment of the mA and/or kV according to patient size and/or use of iterative reconstruction technique. CONTRAST:  75mL OMNIPAQUE IOHEXOL 300 MG/ML  SOLN COMPARISON:  Plain films today FINDINGS: Bones/Joint/Cartilage No acute bony abnormality. Specifically, no fracture, subluxation, or dislocation. No bone destruction to suggest osteomyelitis. Ligaments Suboptimally assessed by CT. Muscles and Tendons Unremarkable Soft tissues Diffuse soft tissue swelling. IMPRESSION: Diffuse soft tissue swelling. No acute bony abnormality or evidence of osteomyelitis. Electronically Signed   By: Charlett Nose M.D.   On:  03/05/2023 19:20   DG Foot Complete Right  Result Date: 03/05/2023 CLINICAL DATA:  Right foot pain and swelling for 3 days. No known trauma. History of cellulitis. EXAM: RIGHT FOOT COMPLETE - 3 VIEW COMPARISON:  None Available. FINDINGS: There is no evidence of fracture or dislocation. There is no evidence of  arthropathy or other focal bone abnormality. Diffuse soft tissue swelling. IMPRESSION: 1. Diffuse soft tissue swelling. 2. No acute osseous abnormality. Electronically Signed   By: Jacob Moores M.D.   On: 03/05/2023 13:59     Medical Consultants:   None.   Subjective:    Holly Ingram she relates her pain is better.  Objective:    Vitals:   03/06/23 1311 03/06/23 1705 03/06/23 2022 03/07/23 0552  BP: (!) 155/92 (!) 161/93 (!) 163/97 (!) 141/92  Pulse: 69 (!) 54 61 (!) 54  Resp: 16 16 18 16   Temp: 98.9 F (37.2 C) 98.9 F (37.2 C) 98.6 F (37 C) 97.7 F (36.5 C)  TempSrc: Oral Oral Oral Oral  SpO2: 91% 100% 100% 100%  Weight:      Height:       SpO2: 100 %   Intake/Output Summary (Last 24 hours) at 03/07/2023 0827 Last data filed at 03/07/2023 0600 Gross per 24 hour  Intake 2339 ml  Output --  Net 2339 ml   Filed Weights   03/05/23 1225  Weight: 54.4 kg    Exam: General exam: In no acute distress. Respiratory system: Good air movement and clear to auscultation. Cardiovascular system: S1 & S2 heard, RRR. No JVD. Gastrointestinal system: Abdomen is nondistended, soft and nontender.  Extremities: Right foot is less swollen not as exquisitely tender to palpation's yesterday. Skin: No rashes, lesions or ulcers Psychiatry: Judgement and insight appear normal. Mood & affect appropriate.   Data Reviewed:    Labs: Basic Metabolic Panel: Recent Labs  Lab 03/05/23 1228 03/06/23 0454 03/07/23 0439  NA 139 136 140  K 3.0* 3.1* 3.9  CL 102 104 107  CO2 27 23 26   GLUCOSE 94 94 85  BUN <5* <5* <5*  CREATININE 0.52 0.53 0.47  CALCIUM 9.1 8.1* 8.2*  MG  --  1.6*  --    GFR Estimated Creatinine Clearance: 68.8 mL/min (by C-G formula based on SCr of 0.47 mg/dL). Liver Function Tests: Recent Labs  Lab 03/05/23 1228 03/06/23 0454 03/07/23 0439  AST 78* 61* 73*  ALT 40 36 34  ALKPHOS 114 97 86  BILITOT 2.2* 2.0* 1.7*  PROT 6.8 6.1* 5.5*  ALBUMIN 4.2  3.5 3.1*   No results for input(s): "LIPASE", "AMYLASE" in the last 168 hours. No results for input(s): "AMMONIA" in the last 168 hours. Coagulation profile Recent Labs  Lab 03/05/23 1228  INR 1.0   COVID-19 Labs  Recent Labs    03/05/23 1313  CRP 0.8    No results found for: "SARSCOV2NAA"  CBC: Recent Labs  Lab 03/05/23 1228 03/06/23 0454 03/07/23 0439  WBC 5.1 4.8 2.9*  NEUTROABS 3.5  --   --   HGB 9.3* 9.0* 8.2*  HCT 25.8* 26.4* 23.6*  MCV 136.5* 142.7* 143.0*  PLT 169 142* 115*   Cardiac Enzymes: Recent Labs  Lab 03/06/23 1013  CKTOTAL 40   BNP (last 3 results) No results for input(s): "PROBNP" in the last 8760 hours. CBG: No results for input(s): "GLUCAP" in the last 168 hours. D-Dimer: No results for input(s): "DDIMER" in the last 72 hours. Hgb A1c: No results for input(s): "  HGBA1C" in the last 72 hours. Lipid Profile: No results for input(s): "CHOL", "HDL", "LDLCALC", "TRIG", "CHOLHDL", "LDLDIRECT" in the last 72 hours. Thyroid function studies: No results for input(s): "TSH", "T4TOTAL", "T3FREE", "THYROIDAB" in the last 72 hours.  Invalid input(s): "FREET3" Anemia work up: Recent Labs    03/06/23 1014  VITAMINB12 189   Sepsis Labs: Recent Labs  Lab 03/05/23 1228 03/05/23 1428 03/05/23 2237 03/06/23 0454 03/07/23 0439  WBC 5.1  --   --  4.8 2.9*  LATICACIDVEN 3.0* 3.3* 2.6*  --   --    Microbiology Recent Results (from the past 240 hour(s))  Culture, blood (routine x 2)     Status: None (Preliminary result)   Collection Time: 03/05/23  1:51 PM   Specimen: BLOOD  Result Value Ref Range Status   Specimen Description   Final    BLOOD LEFT ANTECUBITAL Performed at [facility], [address] [place], [place] [address]    Special Requests   Final    BOTTLES DRAWN AEROBIC AND ANAEROBIC Blood Culture results may not be optimal due to an excessive volume of blood received in culture bottles Performed at [facility], [address], [place], [place] [address]    Culture   Final    NO GROWTH 2 DAYS Performed at [facility], [address] [place], [place] [address]    Report Status PENDING  Incomplete  Culture, blood (routine x 2)     Status: None (Preliminary result)   Collection Time: 03/05/23  1:56 PM   Specimen: BLOOD RIGHT HAND  Result Value Ref Range Status   Specimen Description   Final    BLOOD RIGHT HAND Performed at [facility], [address] [place], [place] [address]    Special Requests   Final    BOTTLES DRAWN AEROBIC AND ANAEROBIC Blood Culture adequate volume Performed at [facility], [address] [place], [place] [address]    Culture   Final    NO GROWTH 2 DAYS Performed at [facility], [address] [place], [place] [address]    Report Status PENDING  Incomplete     Medications:    cyanocobalamin  1,000 mcg Subcutaneous Daily   enoxaparin (LOVENOX) injection  40 mg Subcutaneous Q24H   folic acid  1 mg Oral Daily   gabapentin  100 mg Oral TID   naproxen  500 mg Oral BID WC   nicotine  14 mg Transdermal Daily   pantoprazole  40 mg Oral BID   Continuous Infusions:  ampicillin-sulbactam (UNASYN) IV 3 g (03/07/23 0440)      LOS: 2 days   Marinda Elk  [facility]  03/07/2023, 8:27 AM

## 2023-03-08 DIAGNOSIS — R7989 Other specified abnormal findings of blood chemistry: Secondary | ICD-10-CM | POA: Diagnosis not present

## 2023-03-08 DIAGNOSIS — M7989 Other specified soft tissue disorders: Secondary | ICD-10-CM | POA: Diagnosis not present

## 2023-03-08 DIAGNOSIS — R600 Localized edema: Secondary | ICD-10-CM | POA: Diagnosis not present

## 2023-03-08 MED ORDER — PANTOPRAZOLE SODIUM 40 MG PO TBEC: 40.0000 mg | DELAYED_RELEASE_TABLET | Freq: Two times a day (BID) | ORAL | 0 refills | Status: DC

## 2023-03-08 MED ORDER — DOXYCYCLINE HYCLATE 100 MG PO TABS: 100.0000 mg | ORAL_TABLET | Freq: Two times a day (BID) | ORAL | 0 refills | Status: AC

## 2023-03-08 MED ORDER — NAPROXEN 500 MG PO TABS: 500.0000 mg | ORAL_TABLET | Freq: Two times a day (BID) | ORAL | 0 refills | Status: AC

## 2023-03-08 MED ORDER — MULTI-VITAMIN/MINERALS PO TABS: 1.0000 | ORAL_TABLET | Freq: Every day | ORAL | 1 refills | Status: AC

## 2023-03-08 MED ORDER — CYANOCOBALAMIN 1000 MCG/ML IJ SOLN: 1000.0000 ug | Freq: Every day | INTRAMUSCULAR | Status: AC

## 2023-03-08 NOTE — Progress Notes (Signed)
Discharge instructions reviewed with pt and daughter. All questions answered. Pt taken to front entrance to meet ride in stable condition.

## 2023-03-08 NOTE — Evaluation (Signed)
Physical Therapy Evaluation Patient Details Name: Holly Ingram MRN: [id number] DOB: 12-May-1976 Today's Date: 03/08/2023  History of Present Illness  47 y.o. female past medical history significant for chronic anemia, hyperbilirubinemia comes into the emergency room for worsening swelling and erythema of the right foot that started 3 days prior to admission.  She relates no trauma or falls.  CT showed no fluid collection fracture or dislocation or osteomyelitis just diffuse tissue swelling.  Clinical Impression  The patient was instructed ion PROM and strengthening exercises for right ankle. Patient  reports impaired sensation on planter foot. Patient using knee walker. Patient stood and able to bear min weight on the right foot reporting pain. Patient reports that she will follow up with PCP and OPPT as needed. No further PT interventions.        Assistance Recommended at Discharge Set up Supervision/Assistance  If plan is discharge home, recommend the following:  Can travel by private vehicle  Help with stairs or ramp for entrance;Assistance with cooking/housework        Equipment Recommendations None recommended by PT  Recommendations for Other Services       Functional Status Assessment Patient has had a recent decline in their functional status and demonstrates the ability to make significant improvements in function in a reasonable and predictable amount of time.     Precautions / Restrictions Precautions Precautions: Fall Precaution Comments: using knee scooter Restrictions Weight Bearing Restrictions: No      Mobility  Bed Mobility Overal bed mobility: Independent                  Transfers Overall transfer level: Modified independent                      Ambulation/Gait               General Gait Details: NT  Stairs            Wheelchair Mobility     Tilt Bed    Modified Rankin (Stroke Patients Only)       Balance  Overall balance assessment: No apparent balance deficits (not formally assessed)                                           Pertinent Vitals/Pain Pain Assessment Pain Assessment: Faces Faces Pain Scale: Hurts even more Pain Location: dorsal right foot, noted edema Pain Descriptors / Indicators: Discomfort, Pressure Pain Intervention(s): Monitored during session    Home Living Family/patient expects to be discharged to:: Private residence Living Arrangements: Spouse/significant other Available Help at Discharge: Family Type of Home: House         Home Layout: One level   Additional Comments: knee scooter    Prior Function Prior Level of Function : Independent/Modified Independent                     Hand Dominance   Dominant Hand: Right    Extremity/Trunk Assessment   Upper Extremity Assessment Upper Extremity Assessment: Overall WFL for tasks assessed    Lower Extremity Assessment Lower Extremity Assessment: RLE deficits/detail RLE Deficits / Details: edema of ankle and foot, noted active  all planes but weakness, 2/5 strengrth in DF, PF, ev/inv, endorses  impaired sensation plantar surface" feels dead"       Communication   Communication: No difficulties  Cognition  Arousal/Alertness: Awake/alert Behavior During Therapy: WFL for tasks assessed/performed Overall Cognitive Status: Within Functional Limits for tasks assessed                                          General Comments      Exercises Other Exercises Other Exercises: passive  dorsiflex stretch with pillow case, strengthening with yelow TB for alll planes right ankle   Assessment/Plan    PT Assessment All further PT needs can be met in the next venue of care  PT Problem List Decreased strength;Impaired sensation;Decreased range of motion       PT Treatment Interventions Therapeutic activities;Therapeutic exercise;Patient/family education    PT Goals  (Current goals can be found in the Care Plan section)  Acute Rehab PT Goals Patient Stated Goal: to get my right foot right PT Goal Formulation: All assessment and education complete, DC therapy    Frequency       Co-evaluation               AM-PAC PT "6 Clicks" Mobility  Outcome Measure Help needed turning from your back to your side while in a flat bed without using bedrails?: None Help needed moving from lying on your back to sitting on the side of a flat bed without using bedrails?: None Help needed moving to and from a bed to a chair (including a wheelchair)?: None Help needed standing up from a chair using your arms (e.g., wheelchair or bedside chair)?: None Help needed to walk in hospital room?: A Little Help needed climbing 3-5 steps with a railing? : A Little 6 Click Score: 22    End of Session   Activity Tolerance: Patient tolerated treatment well Patient left: in bed;with family/visitor present Nurse Communication:  (HEP given) PT Visit Diagnosis: Difficulty in walking, not elsewhere classified (R26.2)    Time: 1610-9604 PT Time Calculation (min) (ACUTE ONLY): 18 min   Charges:   PT Evaluation $PT Eval Low Complexity: 1 Low   PT General Charges $$ ACUTE PT VISIT: 1 Visit         Blanchard Kelch PT Acute Rehabilitation Services Office [phone] Weekend pager-[phone]   Rada Hay 03/08/2023, 2:22 PM

## 2023-03-08 NOTE — Discharge Summary (Signed)
Physician Discharge Summary  Holly Ingram TKZ:[id number] DOB: 03/29/1976 DOA: 03/05/2023  PCP: [facility]  Admit date: 03/05/2023 Discharge date: 03/08/2023  Admitted From: home Disposition:  home  Recommendations for Outpatient Follow-up:  Follow up with PCP in 1-2 weeks Please obtain BMP/CBC in one week  Home Health:no Equipment/Devices:none  Discharge Condition:stable CODE STATUS:Full Diet recommendation: Heart Healthy   Brief/Interim Summary: 47 y.o. female past medical history significant for chronic anemia, hyperbilirubinemia comes into the emergency room for worsening swelling and erythema of the right foot that started 3 days prior to admission.  She relates no trauma or falls.  CT showed no fluid collection fracture or dislocation for her osteomyelitis just diffuse tissue swelling.   Discharge Diagnoses:  Principal Problem:   Swelling of right foot Active Problems:   Hypokalemia   Macrocytic anemia   Hyperbilirubinemia   Leukopenia   Thrombocytopenia (HCC)  Right foot swelling and pain: She will start empirically on IV antibiotics, blood cultures remain negative to date. She was transition to oral doxycycline. HIV has been nonreactive. Sed rate of 16 PT and INR 13 and 1. CT scan of the foot showed no fluid collection no fracture or dislocation or osteomyelitis just diffuse tissue swelling.  There is no joint effusion. It was exquisitely tender to palpation she was started on naproxen which improved her pain significantly, she was also started on Protonix twice a day for GI prophylaxis. Will continue this treatment as an outpatient.  Hypokalemia: Replete orally now resolved.  Elevated LFTs: CK was unremarkable there was a 2-1 split in LFTs likely due to alcohol abuse. Acute hepatitis panel was negative.  Macrocytic anemia: Back in 2023 he was diagnosed with low B12 and folate.  Probably alcohol use contributing to her macrocytosis. She was  started on B12 supplementation and she will continue this as an outpatient with her PCP, with subcutaneous B12 repletion. She will follow-up with hematology oncology as an outpatient to follow-up on her pancytopenia  Pancytopenia/thrombocytopenia: Likely due to alcohol abuse that have remained stable.  Pancytopenia/leukopenia: Likely due to alcohol abuse this has remained stable.  Heavy alcohol use: She relates she drinks about 5-6 beers every day has been doing this for 10 years, there were no signs of withdrawal she was started on thiamine and folate she will continue repletion as an outpatient.   Discharge Instructions  Discharge Instructions     Diet - low sodium heart healthy   Complete by: As directed    Increase activity slowly   Complete by: As directed       Allergies as of 03/08/2023       Reactions   Morphine Rash        Medication List     STOP taking these medications    ibuprofen 200 MG tablet Commonly known as: ADVIL   methocarbamol 500 MG tablet Commonly known as: ROBAXIN       TAKE these medications    BIOFREEZE EX Apply 1 Application topically in the morning, at noon, in the evening, and at bedtime.   CLEAR EYES ALL SEASONS OP Place 2 drops into both eyes in the morning and at bedtime.   cyanocobalamin 1000 MCG/ML injection Commonly known as: VITAMIN B12 Inject 1 mL (1,000 mcg total) into the skin daily for 4 days.   doxycycline 100 MG tablet Commonly known as: VIBRA-TABS Take 1 tablet (100 mg total) by mouth every 12 (twelve) hours for 6 days.   gabapentin 100 MG capsule Commonly known  as: NEURONTIN Take 100 mg by mouth 3 (three) times daily.   multivitamin with minerals tablet Take 1 tablet by mouth daily.   naproxen 500 MG tablet Commonly known as: NAPROSYN Take 1 tablet (500 mg total) by mouth 2 (two) times daily with a meal for 3 days.   pantoprazole 40 MG tablet Commonly known as: PROTONIX Take 1 tablet (40 mg total) by  mouth 2 (two) times daily for 7 days.   potassium chloride SA 20 MEQ tablet Commonly known as: KLOR-CON M Take 1 tablet (20 mEq total) by mouth daily for 5 days.   VITAMIN C CR 1500 MG Tbcr Take 1,500 mg by mouth daily.        Follow-up Information     [facility] Follow up.   Specialty: Oncology Why: cqall for appointment Contact information: [address] [place] [address] [phone]               Allergies  Allergen Reactions   Morphine Rash    Consultations: None   Procedures/Studies: CT FOOT RIGHT W CONTRAST  Result Date: 03/05/2023 CLINICAL DATA:  Right foot pain, swelling EXAM: CT OF THE LOWER RIGHT EXTREMITY WITH CONTRAST TECHNIQUE: Multidetector CT imaging of the lower right extremity was performed according to the standard protocol following intravenous contrast administration. RADIATION DOSE REDUCTION: This exam was performed according to the departmental dose-optimization program which includes automated exposure control, adjustment of the mA and/or kV according to patient size and/or use of iterative reconstruction technique. CONTRAST:  75mL OMNIPAQUE IOHEXOL 300 MG/ML  SOLN COMPARISON:  Plain films today FINDINGS: Bones/Joint/Cartilage No acute bony abnormality. Specifically, no fracture, subluxation, or dislocation. No bone destruction to suggest osteomyelitis. Ligaments Suboptimally assessed by CT. Muscles and Tendons Unremarkable Soft tissues Diffuse soft tissue swelling. IMPRESSION: Diffuse soft tissue swelling. No acute bony abnormality or evidence of osteomyelitis. Electronically Signed   By: Charlett Nose M.D.   On: 03/05/2023 19:20   DG Foot Complete Right  Result Date: 03/05/2023 CLINICAL DATA:  Right foot pain and swelling for 3 days. No known trauma. History of cellulitis. EXAM: RIGHT FOOT COMPLETE - 3 VIEW COMPARISON:  None Available. FINDINGS: There is no evidence of fracture or dislocation. There is no  evidence of arthropathy or other focal bone abnormality. Diffuse soft tissue swelling. IMPRESSION: 1. Diffuse soft tissue swelling. 2. No acute osseous abnormality. Electronically Signed   By: Jacob Moores M.D.   On: 03/05/2023 13:59   (Echo, Carotid, EGD, Colonoscopy, ERCP)    Subjective: Relates her pain is better.   Discharge Exam: Vitals:   03/07/23 2107 03/08/23 0633  BP: (!) 151/83 (!) 138/101  Pulse: 71 64  Resp: 18 18  Temp: 98.8 F (37.1 C) 97.7 F (36.5 C)  SpO2: 99% 100%   Vitals:   03/07/23 0552 03/07/23 1348 03/07/23 2107 03/08/23 0633  BP: (!) 141/92 (!) 144/90 (!) 151/83 (!) 138/101  Pulse: (!) 54 62 71 64  Resp: 16  18 18   Temp: 97.7 F (36.5 C) 98.6 F (37 C) 98.8 F (37.1 C) 97.7 F (36.5 C)  TempSrc: Oral Oral Oral Oral  SpO2: 100% 100% 99% 100%  Weight:      Height:        General: Pt is alert, awake, not in acute distress Cardiovascular: RRR, S1/S2 +, no rubs, no gallops Respiratory: CTA bilaterally, no wheezing, no rhonchi Abdominal: Soft, NT, ND, bowel sounds + Extremities: no edema, no cyanosis  The results of significant diagnostics from this hospitalization (including imaging, microbiology, ancillary and laboratory) are listed below for reference.     Microbiology: Recent Results (from the past 240 hour(s))  Culture, blood (routine x 2)     Status: None (Preliminary result)   Collection Time: 03/05/23  1:51 PM   Specimen: BLOOD  Result Value Ref Range Status   Specimen Description   Final    BLOOD LEFT ANTECUBITAL Performed at [facility], [address] [place], [place] [address]    Special Requests   Final    BOTTLES DRAWN AEROBIC AND ANAEROBIC Blood Culture results may not be optimal due to an excessive volume of blood received in culture bottles Performed at [facility], [address], [place], [place] [address]    Culture   Final    NO GROWTH 3 DAYS Performed at [facility], [address] [place], [place] [address]    Report Status PENDING  Incomplete  Culture, blood (routine x 2)     Status: None (Preliminary result)   Collection Time: 03/05/23  1:56 PM   Specimen: BLOOD RIGHT HAND  Result Value Ref Range Status   Specimen Description   Final    BLOOD RIGHT HAND Performed at [facility], [address], [place], [place] [address]    Special Requests   Final    BOTTLES DRAWN AEROBIC AND ANAEROBIC Blood Culture adequate volume Performed at [facility], [address] [place], [place] [address]    Culture   Final    NO GROWTH 3 DAYS Performed at [facility], [address], [place], [place] [address]    Report Status PENDING  Incomplete     Labs: BNP (last 3 results) No results for input(s): "BNP" in the last 8760 hours. Basic Metabolic Panel: Recent Labs  Lab 03/05/23 1228 03/06/23 0454 03/07/23 0439 03/08/23 0421  NA 139 136 140 139  K 3.0* 3.1* 3.9 4.1  CL 102 104 107 107  CO2 27 23 26 25   GLUCOSE 94 94 85 85  BUN <5* <5* <5* <5*  CREATININE 0.52 0.53 0.47 0.58  CALCIUM 9.1 8.1* 8.2* 8.1*  MG  --  1.6*  --   --    Liver Function Tests: Recent Labs  Lab 03/05/23 1228 03/06/23 0454 03/07/23 0439 03/08/23 0421  AST 78* 61* 73* 50*  ALT 40 36 34 29  ALKPHOS 114 97 86 79  BILITOT 2.2* 2.0* 1.7* 1.3*  PROT 6.8 6.1* 5.5* 5.2*  ALBUMIN 4.2 3.5 3.1* 2.9*   No results for input(s): "LIPASE", "AMYLASE" in the last 168 hours. No results for input(s): "AMMONIA" in the last 168 hours. CBC: Recent Labs  Lab 03/05/23 1228 03/06/23 0454 03/06/23 1014 03/07/23 0439 03/08/23 0421  WBC 5.1 4.8  --  2.9* 2.6*  NEUTROABS 3.5  --   --   --   --   HGB 9.3* 9.0*  --  8.2* 7.7*  HCT 25.8* 26.4* 22.6* 23.6* 23.0*  MCV 136.5* 142.7*  --  143.0* 143.8*  PLT 169 142*  --  115* 121*   Cardiac Enzymes: Recent Labs  Lab 03/06/23 1013  CKTOTAL 40   BNP: Invalid input(s):  "POCBNP" CBG: No results for input(s): "GLUCAP" in the last 168 hours. D-Dimer No results for input(s): "DDIMER" in the last 72 hours. Hgb A1c No results for input(s): "HGBA1C" in the last 72 hours. Lipid Profile No results for input(s): "CHOL", "  HDL", "LDLCALC", "TRIG", "CHOLHDL", "LDLDIRECT" in the last 72 hours. Thyroid function studies No results for input(s): "TSH", "T4TOTAL", "T3FREE", "THYROIDAB" in the last 72 hours.  Invalid input(s): "FREET3" Anemia work up Recent Labs    03/06/23 1014  VITAMINB12 189   Urinalysis    Component Value Date/Time   COLORURINE YELLOW 03/05/2023 1540   APPEARANCEUR CLEAR 03/05/2023 1540   LABSPEC 1.010 03/05/2023 1540   PHURINE 8.0 03/05/2023 1540   GLUCOSEU NEGATIVE 03/05/2023 1540   HGBUR NEGATIVE 03/05/2023 1540   BILIRUBINUR NEGATIVE 03/05/2023 1540   KETONESUR NEGATIVE 03/05/2023 1540   PROTEINUR TRACE (A) 03/05/2023 1540   NITRITE NEGATIVE 03/05/2023 1540   LEUKOCYTESUR SMALL (A) 03/05/2023 1540   Sepsis Labs Recent Labs  Lab 03/05/23 1228 03/06/23 0454 03/07/23 0439 03/08/23 0421  WBC 5.1 4.8 2.9* 2.6*   Microbiology Recent Results (from the past 240 hour(s))  Culture, blood (routine x 2)     Status: None (Preliminary result)   Collection Time: 03/05/23  1:51 PM   Specimen: BLOOD  Result Value Ref Range Status   Specimen Description   Final    BLOOD LEFT ANTECUBITAL Performed at [facility], [address], [place], [place] [address]    Special Requests   Final    BOTTLES DRAWN AEROBIC AND ANAEROBIC Blood Culture results may not be optimal due to an excessive volume of blood received in culture bottles Performed at [facility], [address] [place], [place] [address]    Culture   Final    NO GROWTH 3 DAYS Performed at [facility], [address], [place], [place] [address]    Report Status PENDING  Incomplete  Culture, blood (routine x 2)     Status: None  (Preliminary result)   Collection Time: 03/05/23  1:56 PM   Specimen: BLOOD RIGHT HAND  Result Value Ref Range Status   Specimen Description   Final    BLOOD RIGHT HAND Performed at [facility], [address], [place], [place] [address]    Special Requests   Final    BOTTLES DRAWN AEROBIC AND ANAEROBIC Blood Culture adequate volume Performed at [facility], [address], [place], [place] [address]    Culture   Final    NO GROWTH 3 DAYS Performed at [facility], [address], [place], [place] [address]    Report Status PENDING  Incomplete    SIGNED:   Marinda Elk, MD  [facility] 03/08/2023, 9:58 AM Pager   If 7PM-7AM, please contact night-coverage [email or web address] Password [email or web address]

## 2023-03-25 ENCOUNTER — Inpatient Hospital Stay: Payer: Medicaid Other | Attending: Oncology | Admitting: Oncology

## 2023-03-25 ENCOUNTER — Encounter: Payer: Self-pay | Admitting: Oncology

## 2023-03-25 ENCOUNTER — Inpatient Hospital Stay: Payer: Medicaid Other

## 2023-03-25 VITALS — BP 178/86 | HR 70 | Temp 98.5°F | Resp 16 | Ht 62.0 in | Wt 122.0 lb

## 2023-03-25 DIAGNOSIS — D7589 Other specified diseases of blood and blood-forming organs: Secondary | ICD-10-CM | POA: Insufficient documentation

## 2023-03-25 DIAGNOSIS — D61818 Other pancytopenia: Secondary | ICD-10-CM

## 2023-03-25 DIAGNOSIS — Z79899 Other long term (current) drug therapy: Secondary | ICD-10-CM | POA: Insufficient documentation

## 2023-03-25 DIAGNOSIS — F17210 Nicotine dependence, cigarettes, uncomplicated: Secondary | ICD-10-CM | POA: Diagnosis not present

## 2023-03-25 LAB — IRON AND TIBC
Iron: 155 ug/dL (ref 28–170)
Saturation Ratios: 38 % — ABNORMAL HIGH (ref 10.4–31.8)
TIBC: 407 ug/dL (ref 250–450)
UIBC: 252 ug/dL

## 2023-03-25 LAB — CBC (CANCER CENTER ONLY)
HCT: 35.4 % — ABNORMAL LOW (ref 36.0–46.0)
Hemoglobin: 12 g/dL (ref 12.0–15.0)
MCH: 43.2 pg — ABNORMAL HIGH (ref 26.0–34.0)
MCHC: 33.9 g/dL (ref 30.0–36.0)
MCV: 127.3 fL — ABNORMAL HIGH (ref 80.0–100.0)
Platelet Count: 154 10*3/uL (ref 150–400)
RBC: 2.78 MIL/uL — ABNORMAL LOW (ref 3.87–5.11)
RDW: 14.3 % (ref 11.5–15.5)
WBC Count: 4.1 10*3/uL (ref 4.0–10.5)
nRBC: 0 % (ref 0.0–0.2)

## 2023-03-25 LAB — FERRITIN: Ferritin: 74 ng/mL (ref 11–307)

## 2023-03-25 LAB — COMPREHENSIVE METABOLIC PANEL
ALT: 45 U/L — ABNORMAL HIGH (ref 0–44)
AST: 65 U/L — ABNORMAL HIGH (ref 15–41)
Albumin: 4.2 g/dL (ref 3.5–5.0)
Alkaline Phosphatase: 79 U/L (ref 38–126)
Anion gap: 10 (ref 5–15)
BUN: 7 mg/dL (ref 6–20)
CO2: 24 mmol/L (ref 22–32)
Calcium: 9.2 mg/dL (ref 8.9–10.3)
Chloride: 102 mmol/L (ref 98–111)
Creatinine, Ser: 0.63 mg/dL (ref 0.44–1.00)
GFR, Estimated: >60 mL/min (ref >60)
Glucose, Bld: 97 mg/dL (ref 70–99)
Potassium: 3.5 mmol/L (ref 3.5–5.1)
Sodium: 136 mmol/L (ref 135–145)
Total Bilirubin: 1.3 mg/dL — ABNORMAL HIGH (ref 0.3–1.2)
Total Protein: 7.4 g/dL (ref 6.5–8.1)

## 2023-03-25 LAB — LACTATE DEHYDROGENASE: LDH: 181 U/L (ref 98–192)

## 2023-03-25 LAB — FOLATE: Folate: 14.8 ng/mL (ref >5.9)

## 2023-03-25 LAB — VITAMIN B12: Vitamin B-12: 365 pg/mL (ref 180–914)

## 2023-03-25 NOTE — Progress Notes (Signed)
[facility]  [phone] [phone]  ID: Holly Ingram OB: 06-07-1976  MR#: [id number]  GMW#:[id number]  Patient Care Team: [facility] Holly Ingram as PCP - General  CHIEF COMPLAINT: Pancytopenia.  INTERVAL HISTORY: Patient is a 47 year old female who was recently admitted to the hospital for a gout flare he was found to have a new onset pancytopenia.  She reports that she was told she was folate and B12 deficient possibly based on her significantly elevated MCV, but results at that time were within normal limits.  She currently feels well and is back to her baseline.  She has no neurological plaints.  She denies any recent fevers.  She has good appetite and denies weight loss.  She has no chest pain, shortness of breath, cough, or hemoptysis.  She denies any nausea, vomiting, constipation, or diarrhea.  She has no urinary complaints.  Patient feels at her baseline and offers no specific complaints today.  REVIEW OF SYSTEMS:   Review of Systems  Constitutional: Negative.  Negative for fever, malaise/fatigue and weight loss.  Respiratory: Negative.  Negative for cough, hemoptysis and shortness of breath.   Cardiovascular: Negative.  Negative for chest pain and leg swelling.  Gastrointestinal: Negative.  Negative for abdominal pain.  Genitourinary: Negative.  Negative for dysuria.  Musculoskeletal: Negative.  Negative for joint pain.  Skin: Negative.  Negative for rash.  Neurological: Negative.  Negative for dizziness, focal weakness, weakness and headaches.  Psychiatric/Behavioral: Negative.  The patient is not nervous/anxious.     As per HPI. Otherwise, a complete review of systems is negative.  PAST Ingram HISTORY: History reviewed. No pertinent past Ingram history.  PAST SURGICAL HISTORY: History reviewed. No pertinent surgical history.  FAMILY HISTORY: History reviewed. No pertinent family history.  ADVANCED DIRECTIVES (Y/N):   N  HEALTH MAINTENANCE: Social History   Tobacco Use   Smoking status: Every Day    Current packs/day: 1.00    Average packs/day: 1 pack/day for 36.6 years (36.6 ttl pk-yrs)    Types: Cigarettes    Start date: 1988   Smokeless tobacco: Never   Tobacco comments:    Patient would like to try chantix?  Substance Use Topics   Alcohol use: Yes    Alcohol/week: 3.0 standard drinks of alcohol    Types: 3 Cans of beer per week   Drug use: Yes    Frequency: 7.0 times per week    Types: Marijuana    Comment: Marijuana use daily     Colonoscopy:  PAP:  Bone density:  Lipid panel:  Allergies  Allergen Reactions   Morphine Rash    Current Outpatient Medications  Medication Sig Dispense Refill   acetaminophen (TYLENOL) 650 MG CR tablet Take 650 mg by mouth every 8 (eight) hours as needed for pain.     Ascorbic Acid (VITAMIN C CR) 1500 MG TBCR Take 1,500 mg by mouth daily.     gabapentin (NEURONTIN) 100 MG capsule Take 100 mg by mouth 3 (three) times daily.     Menthol, Topical Analgesic, (BIOFREEZE EX) Apply 1 Application topically in the morning, at noon, in the evening, and at bedtime.     Multiple Vitamins-Minerals (MULTIVITAMIN WITH MINERALS) tablet Take 1 tablet by mouth daily. 30 tablet 1   Polyvinyl Alcohol-Povidone (CLEAR EYES ALL SEASONS OP) Place 2 drops into both eyes in the morning and at bedtime.     potassium chloride SA (KLOR-CON M) 20 MEQ tablet Take 1 tablet (20 mEq total) by mouth  daily for 5 days. 5 tablet 0   No current facility-administered medications for this visit.    OBJECTIVE: Vitals:   03/25/23 1129  BP: (!) 178/86  Pulse: 70  Resp: 16  Temp: 98.5 F (36.9 C)  SpO2: 100%     Body mass index is 22.31 kg/m.    ECOG FS:0 - Asymptomatic  General: Well-developed, well-nourished, no acute distress. Eyes: Pink conjunctiva, anicteric sclera. HEENT: Normocephalic, moist mucous membranes. Lungs: No audible wheezing or coughing. Heart: Regular rate and  rhythm. Abdomen: Soft, nontender, no obvious distention. Musculoskeletal: No edema, cyanosis, or clubbing. Neuro: Alert, answering all questions appropriately. Cranial nerves grossly intact. Skin: No rashes or petechiae noted. Psych: Normal affect. Lymphatics: No cervical, calvicular, axillary or inguinal LAD.   LAB RESULTS:  Lab Results  Component Value Date   NA 136 03/25/2023   K 3.5 03/25/2023   CL 102 03/25/2023   CO2 24 03/25/2023   GLUCOSE 97 03/25/2023   BUN 7 03/25/2023   CREATININE 0.63 03/25/2023   CALCIUM 9.2 03/25/2023   PROT 7.4 03/25/2023   ALBUMIN 4.2 03/25/2023   AST 65 (H) 03/25/2023   ALT 45 (H) 03/25/2023   ALKPHOS 79 03/25/2023   BILITOT 1.3 (H) 03/25/2023   GFRNONAA >60 03/25/2023    Lab Results  Component Value Date   WBC 4.1 03/25/2023   NEUTROABS 3.5 03/05/2023   HGB 12.0 03/25/2023   HCT 35.4 (L) 03/25/2023   MCV 127.3 (H) 03/25/2023   PLT 154 03/25/2023     STUDIES: CT FOOT RIGHT W CONTRAST  Result Date: 03/05/2023 CLINICAL DATA:  Right foot pain, swelling EXAM: CT OF THE LOWER RIGHT EXTREMITY WITH CONTRAST TECHNIQUE: Multidetector CT imaging of the lower right extremity was performed according to the standard protocol following intravenous contrast administration. RADIATION DOSE REDUCTION: This exam was performed according to the departmental dose-optimization program which includes automated exposure control, adjustment of the mA and/or kV according to patient size and/or use of iterative reconstruction technique. CONTRAST:  75mL OMNIPAQUE IOHEXOL 300 MG/ML  SOLN COMPARISON:  Plain films today FINDINGS: Bones/Joint/Cartilage No acute bony abnormality. Specifically, no fracture, subluxation, or dislocation. No bone destruction to suggest osteomyelitis. Ligaments Suboptimally assessed by CT. Muscles and Tendons Unremarkable Soft tissues Diffuse soft tissue swelling. IMPRESSION: Diffuse soft tissue swelling. No acute bony abnormality or evidence  of osteomyelitis. Electronically Signed   By: Charlett Nose M.D.   On: 03/05/2023 19:20   DG Foot Complete Right  Result Date: 03/05/2023 CLINICAL DATA:  Right foot pain and swelling for 3 days. No known trauma. History of cellulitis. EXAM: RIGHT FOOT COMPLETE - 3 VIEW COMPARISON:  None Available. FINDINGS: There is no evidence of fracture or dislocation. There is no evidence of arthropathy or other focal bone abnormality. Diffuse soft tissue swelling. IMPRESSION: 1. Diffuse soft tissue swelling. 2. No acute osseous abnormality. Electronically Signed   By: Jacob Moores M.D.   On: 03/05/2023 13:59    ASSESSMENT: Pancytopenia.  PLAN:    Pancytopenia: Resolved.  Patient's white blood cell count, hemoglobin, and platelets are now within normal limits.  She is also asymptomatic.  Iron stores, folate, and LDH are within normal limits.  Remainder of her lab work including B12 levels and is pending at time of dictation.  No intervention is needed at this time.  Patient does not require bone marrow biopsy.  She will have video-assisted telemedicine visit in 3 weeks to discuss the results. Macrocytosis: Without anemia.  Improving.  Patient has  been instructed to continue B12 and folate supplementation. Liver dysfunction: Liver enzymes as well as bilirubin are mildly elevated, possibly secondary to chronic alcohol use.  Follow-up with GI as indicated.  I spent a total of 45 minutes reviewing chart data, face-to-face evaluation with the patient, counseling and coordination of care as detailed above.  Patient expressed understanding and was in agreement with this plan. She also understands that She can call clinic at any time with any questions, concerns, or complaints.    Jeralyn Ruths, MD   03/25/2023 3:55 PM

## 2023-04-05 DIAGNOSIS — M4126 Other idiopathic scoliosis, lumbar region: Secondary | ICD-10-CM | POA: Diagnosis not present

## 2023-04-16 ENCOUNTER — Inpatient Hospital Stay: Payer: Medicaid Other | Admitting: Oncology

## 2023-04-16 NOTE — Progress Notes (Signed)
This encounter was created in error - please disregard.

## 2023-04-25 ENCOUNTER — Inpatient Hospital Stay: Payer: Medicaid Other | Attending: Oncology | Admitting: Oncology

## 2023-04-25 DIAGNOSIS — D61818 Other pancytopenia: Secondary | ICD-10-CM

## 2023-04-25 NOTE — Progress Notes (Signed)
[facility]  Telephone:(336) [phone] [phone]) [phone]  ID: Holly Ingram OB: August 18, 1975  MR#: [id number]  OVF#:[id number]  Patient Care Team: [facility], [facility] as PCP - General  I connected with Holly Ingram on 04/25/23 at  2:45 PM EDT by video enabled telemedicine visit and verified that I am speaking with the correct person using two identifiers.   I discussed the limitations, risks, security and privacy concerns of performing an evaluation and management service by telemedicine and the availability of in-person appointments. I also discussed with the patient that there may be a patient responsible charge related to this service. The patient expressed understanding and agreed to proceed.   Other persons participating in the visit and their role in the encounter: Patient, MD.  Patient's location: Home. Provider's location: Clinic.  CHIEF COMPLAINT: Pancytopenia.  INTERVAL HISTORY: Patient agreed to video assisted telemedicine visit for further evaluation and discussion of her laboratory results.  She currently feels well and is asymptomatic.  She has no neurologic complaints.  She denies any recent fevers.  She has a good appetite and denies weight loss.  She has no chest pain, shortness of breath, cough, or hemoptysis.  She denies any nausea, vomiting, constipation, or diarrhea.  She has no urinary complaints.  Patient offers no specific complaints today.  REVIEW OF SYSTEMS:   Review of Systems  Constitutional: Negative.  Negative for fever, malaise/fatigue and weight loss.  Respiratory: Negative.  Negative for cough, hemoptysis and shortness of breath.   Cardiovascular: Negative.  Negative for chest pain and leg swelling.  Gastrointestinal: Negative.  Negative for abdominal pain.  Genitourinary: Negative.  Negative for dysuria.  Musculoskeletal: Negative.  Negative for joint pain.  Skin: Negative.  Negative for rash.  Neurological: Negative.  Negative  for dizziness, focal weakness, weakness and headaches.  Psychiatric/Behavioral: Negative.  The patient is not nervous/anxious.     As per HPI. Otherwise, a complete review of systems is negative.  PAST MEDICAL HISTORY: No past medical history on file.  PAST SURGICAL HISTORY: No past surgical history on file.  FAMILY HISTORY: No family history on file.  ADVANCED DIRECTIVES (Y/N):  N  HEALTH MAINTENANCE: Social History   Tobacco Use   Smoking status: Every Day    Current packs/day: 1.00    Average packs/day: 1 pack/day for 36.7 years (36.7 ttl pk-yrs)    Types: Cigarettes    Start date: 1988   Smokeless tobacco: Never   Tobacco comments:    Patient would like to try chantix?  Substance Use Topics   Alcohol use: Yes    Alcohol/week: 3.0 standard drinks of alcohol    Types: 3 Cans of beer per week   Drug use: Yes    Frequency: 7.0 times per week    Types: Marijuana    Comment: Marijuana use daily     Colonoscopy:  PAP:  Bone density:  Lipid panel:  Allergies  Allergen Reactions   Morphine Rash    Current Outpatient Medications  Medication Sig Dispense Refill   acetaminophen (TYLENOL) 650 MG CR tablet Take 650 mg by mouth every 8 (eight) hours as needed for pain.     Ascorbic Acid (VITAMIN C CR) 1500 MG TBCR Take 1,500 mg by mouth daily.     gabapentin (NEURONTIN) 100 MG capsule Take 100 mg by mouth 3 (three) times daily.     Menthol, Topical Analgesic, (BIOFREEZE EX) Apply 1 Application topically in the morning, at noon, in the evening, and at bedtime.  Multiple Vitamins-Minerals (MULTIVITAMIN WITH MINERALS) tablet Take 1 tablet by mouth daily. 30 tablet 1   Polyvinyl Alcohol-Povidone (CLEAR EYES ALL SEASONS OP) Place 2 drops into both eyes in the morning and at bedtime.     potassium chloride SA (KLOR-CON M) 20 MEQ tablet Take 1 tablet (20 mEq total) by mouth daily for 5 days. 5 tablet 0   No current facility-administered medications for this visit.     OBJECTIVE: There were no vitals filed for this visit.    There is no height or weight on file to calculate BMI.    ECOG FS:0 - Asymptomatic  General: Well-developed, well-nourished, no acute distress. HEENT: Normocephalic. Neuro: Alert, answering all questions appropriately. Cranial nerves grossly intact. Psych: Normal affect.  LAB RESULTS:  Lab Results  Component Value Date   NA 136 03/25/2023   K 3.5 03/25/2023   CL 102 03/25/2023   CO2 24 03/25/2023   GLUCOSE 97 03/25/2023   BUN 7 03/25/2023   CREATININE 0.63 03/25/2023   CALCIUM 9.2 03/25/2023   PROT 7.4 03/25/2023   ALBUMIN 4.2 03/25/2023   AST 65 (H) 03/25/2023   ALT 45 (H) 03/25/2023   ALKPHOS 79 03/25/2023   BILITOT 1.3 (H) 03/25/2023   GFRNONAA >60 03/25/2023    Lab Results  Component Value Date   WBC 4.1 03/25/2023   NEUTROABS 3.5 03/05/2023   HGB 12.0 03/25/2023   HCT 35.4 (L) 03/25/2023   MCV 127.3 (H) 03/25/2023   PLT 154 03/25/2023     STUDIES: No results found.  ASSESSMENT: Pancytopenia.  PLAN:    Pancytopenia: Resolved.  Patient's white blood cell count, hemoglobin, and platelets are now within normal limits.  All of her other laboratory work is also either negative or within normal limits.  She was noted to have positive platelet antibodies, but with a normal platelet count this is likely clinically insignificant.  No intervention is needed.  No further follow-up has been scheduled.   Macrocytosis: Without anemia.  Improving.  Patient has been instructed to continue oral B12 and folate supplementation. Liver dysfunction: Liver enzymes as well as bilirubin are mildly elevated, possibly secondary to chronic alcohol use.  Follow-up with GI as indicated.  I provided 20 minutes of face-to-face video visit time during this encounter which included chart review, counseling, and coordination of care as documented above.   Patient expressed understanding and was in agreement with this plan. She  also understands that She can call clinic at any time with any questions, concerns, or complaints.    Holly Ruths, MD   04/25/2023 4:07 PM

## 2023-05-23 DIAGNOSIS — M5416 Radiculopathy, lumbar region: Secondary | ICD-10-CM | POA: Diagnosis not present

## 2023-06-28 DIAGNOSIS — M5416 Radiculopathy, lumbar region: Secondary | ICD-10-CM | POA: Diagnosis not present

## 2023-07-12 DIAGNOSIS — R059 Cough, unspecified: Secondary | ICD-10-CM | POA: Diagnosis not present

## 2023-07-12 DIAGNOSIS — R0602 Shortness of breath: Secondary | ICD-10-CM | POA: Diagnosis not present

## 2023-07-12 DIAGNOSIS — J189 Pneumonia, unspecified organism: Secondary | ICD-10-CM | POA: Diagnosis not present

## 2023-07-22 NOTE — Therapy (Incomplete)
OUTPATIENT PHYSICAL THERAPY THORACOLUMBAR EVALUATION   Patient Name: Holly Ingram MRN: [id number] DOB:1975/12/18, 47 y.o., female Today's Date: 07/22/2023  END OF SESSION:   No past medical history on file. No past surgical history on file. Patient Active Problem List   Diagnosis Date Noted   Leukopenia 03/07/2023   Thrombocytopenia (HCC) 03/07/2023   Swelling of right foot 03/06/2023   Cellulitis 05/20/2022   Cat bite 05/20/2022   Hypocalcemia 05/20/2022   Macrocytic anemia 05/20/2022   Hyperbilirubinemia 05/20/2022   Hypokalemia 03/18/2022    PCP: [facility]   REFERRING PROVIDER: Bedelia Person, MD   REFERRING DIAG: M54.16 (ICD-10-CM) - Radiculopathy, lumbar region   Rationale for Evaluation and Treatment: Rehabilitation  THERAPY DIAG:  No diagnosis found.  ONSET DATE: ***  SUBJECTIVE:                                                                                                                                                                                           SUBJECTIVE STATEMENT: ***  PERTINENT HISTORY:  ***  PAIN:  Are you having pain? Yes: {yespain:27235::"NPRS scale: ***/10","Pain location: ***","Pain description: ***","Aggravating factors: ***","Relieving factors: ***"}  PRECAUTIONS: {Therapy precautions:24002}  RED FLAGS: {PT Red Flags:29287}   WEIGHT BEARING RESTRICTIONS: {Yes ***/No:24003}  FALLS:  Has patient fallen in last 6 months? {fallsyesno:27318}  LIVING ENVIRONMENT: Lives with: {[facility] lives with:25569::"lives with their family"} Lives in: {Lives in:25570} Stairs: {opstairs:27293} Has following equipment at home: {Assistive devices:23999}  OCCUPATION: ***  PLOF: {PLOF:24004}  PATIENT GOALS: ***  NEXT MD VISIT: ***  OBJECTIVE:  Note: Objective measures were completed at Evaluation unless otherwise noted.  DIAGNOSTIC FINDINGS:  Not available in EPIC  PATIENT SURVEYS:  {rehab  surveys:24030}  SCREENING FOR RED FLAGS: Bowel or bladder incontinence: {Yes/No:304960894} Spinal tumors: {Yes/No:304960894} Cauda equina syndrome: {Yes/No:304960894} Compression fracture: {Yes/No:304960894} Abdominal aneurysm: {Yes/No:304960894}  COGNITION: Overall cognitive status: {cognition:24006}     SENSATION: {sensation:27233}  MUSCLE LENGTH: Hamstrings: Right *** deg; Left *** deg Thomas test: Right *** deg; Left *** deg  POSTURE: {posture:25561}  PALPATION: ***  LUMBAR ROM:   AROM eval  Flexion   Extension   Right lateral flexion   Left lateral flexion   Right rotation   Left rotation    (Blank rows = not tested)  LOWER EXTREMITY ROM:     {AROM/PROM:27142}  Right eval Left eval  Hip flexion    Hip extension    Hip abduction    Hip adduction    Hip internal rotation    Hip external rotation    Knee flexion    Knee extension    Ankle dorsiflexion  Ankle plantarflexion    Ankle inversion    Ankle eversion     (Blank rows = not tested)  LOWER EXTREMITY MMT:    MMT Right eval Left eval  Hip flexion    Hip extension    Hip abduction    Hip adduction    Hip internal rotation    Hip external rotation    Knee flexion    Knee extension    Ankle dorsiflexion    Ankle plantarflexion    Ankle inversion    Ankle eversion     (Blank rows = not tested)  LUMBAR SPECIAL TESTS:  {lumbar special test:25242}  FUNCTIONAL TESTS:  {Functional tests:24029}  GAIT: Distance walked: *** Assistive device utilized: {Assistive devices:23999} Level of assistance: {Levels of assistance:24026} Comments: ***  TODAY'S TREATMENT:    [facility] Adult PT Treatment:                                                DATE: 07/23/23 Therapeutic Exercise: *** Manual Therapy: *** Neuromuscular re-ed: *** Therapeutic Activity: *** Modalities: *** Self Care: ***                                                                                                                                 PATIENT EDUCATION:  Education details: *** Person educated: {Person educated:25204} Education method: {Education Method:25205} Education comprehension: {Education Comprehension:25206}  HOME EXERCISE PROGRAM: ***  ASSESSMENT:  CLINICAL IMPRESSION: Patient is a 47 y.o. female who was seen today for physical therapy evaluation and treatment for M54.16 (ICD-10-CM) - Radiculopathy, lumbar region .   OBJECTIVE IMPAIRMENTS: {opptimpairments:25111}.   ACTIVITY LIMITATIONS: {activitylimitations:27494}  PARTICIPATION LIMITATIONS: {participationrestrictions:25113}  PERSONAL FACTORS: {Personal factors:25162} are also affecting patient's functional outcome.   REHAB POTENTIAL: {rehabpotential:25112}  CLINICAL DECISION MAKING: {clinical decision making:25114}  EVALUATION COMPLEXITY: {Evaluation complexity:25115}   GOALS:  SHORT TERM GOALS: Target date: ***  *** Baseline: Goal status: INITIAL  2.  *** Baseline:  Goal status: INITIAL  3.  *** Baseline:  Goal status: INITIAL  4.  *** Baseline:  Goal status: INITIAL  5.  *** Baseline:  Goal status: INITIAL  6.  *** Baseline:  Goal status: INITIAL  LONG TERM GOALS: Target date: ***  *** Baseline:  Goal status: INITIAL  2.  *** Baseline:  Goal status: INITIAL  3.  *** Baseline:  Goal status: INITIAL  4.  *** Baseline:  Goal status: INITIAL  5.  *** Baseline:  Goal status: INITIAL  6.  *** Baseline:  Goal status: INITIAL  PLAN:  PT FREQUENCY: {rehab frequency:25116}  PT DURATION: {rehab duration:25117}  PLANNED INTERVENTIONS: {rehab planned interventions:25118::"97110-Therapeutic exercises","97530- Therapeutic [phone]- Neuromuscular re-education","97535- Self ONGE","95284- Manual therapy"}.  PLAN FOR NEXT SESSION: ***   Joellyn Rued, PT 07/22/2023, 9:12 PM

## 2023-07-23 ENCOUNTER — Ambulatory Visit: Payer: Medicaid Other

## 2023-08-26 DIAGNOSIS — M5416 Radiculopathy, lumbar region: Secondary | ICD-10-CM | POA: Diagnosis not present

## 2023-09-17 DIAGNOSIS — F322 Major depressive disorder, single episode, severe without psychotic features: Secondary | ICD-10-CM | POA: Diagnosis not present

## 2023-11-05 ENCOUNTER — Telehealth: Payer: Self-pay

## 2023-11-05 DIAGNOSIS — E872 Acidosis, unspecified: Secondary | ICD-10-CM

## 2023-11-11 ENCOUNTER — Telehealth: Payer: Self-pay

## 2023-11-18 NOTE — Progress Notes (Signed)
 Complex Care Management Note Care Guide Note  11/18/2023 Name: Holly Ingram MRN: [id number] DOB: 11-08-1975   Complex Care Management Outreach Attempts: An unsuccessful outreach was attempted for an appointment today.  Follow Up Plan:  No further outreach attempts will be made at this time. We have been unable to contact the patient to offer or enroll patient in complex care management services.  Encounter Outcome:  No Answer  [facility]  [facility], [facility] Health Care Management Assistant Direct Dial: [phone]  Fax: [phone]

## 2024-01-30 DIAGNOSIS — Z79899 Other long term (current) drug therapy: Secondary | ICD-10-CM | POA: Diagnosis not present

## 2024-01-31 DIAGNOSIS — M25551 Pain in right hip: Secondary | ICD-10-CM | POA: Diagnosis not present

## 2024-01-31 DIAGNOSIS — M47816 Spondylosis without myelopathy or radiculopathy, lumbar region: Secondary | ICD-10-CM | POA: Diagnosis not present

## 2024-01-31 DIAGNOSIS — M25552 Pain in left hip: Secondary | ICD-10-CM | POA: Diagnosis not present

## 2024-01-31 DIAGNOSIS — M4126 Other idiopathic scoliosis, lumbar region: Secondary | ICD-10-CM | POA: Diagnosis not present

## 2024-02-05 DIAGNOSIS — Z79899 Other long term (current) drug therapy: Secondary | ICD-10-CM | POA: Diagnosis not present

## 2024-02-13 DIAGNOSIS — M25551 Pain in right hip: Secondary | ICD-10-CM | POA: Diagnosis not present

## 2024-02-17 DIAGNOSIS — M47816 Spondylosis without myelopathy or radiculopathy, lumbar region: Secondary | ICD-10-CM | POA: Diagnosis not present

## 2024-02-27 DIAGNOSIS — Z79899 Other long term (current) drug therapy: Secondary | ICD-10-CM | POA: Diagnosis not present

## 2024-03-03 DIAGNOSIS — Z79899 Other long term (current) drug therapy: Secondary | ICD-10-CM | POA: Diagnosis not present

## 2024-03-06 DIAGNOSIS — M419 Scoliosis, unspecified: Secondary | ICD-10-CM | POA: Diagnosis not present

## 2024-03-06 DIAGNOSIS — I10 Essential (primary) hypertension: Secondary | ICD-10-CM | POA: Diagnosis not present

## 2024-03-06 DIAGNOSIS — Z131 Encounter for screening for diabetes mellitus: Secondary | ICD-10-CM | POA: Diagnosis not present

## 2024-03-06 DIAGNOSIS — Z13220 Encounter for screening for lipoid disorders: Secondary | ICD-10-CM | POA: Diagnosis not present

## 2024-03-06 DIAGNOSIS — M545 Low back pain, unspecified: Secondary | ICD-10-CM | POA: Diagnosis not present

## 2024-03-06 DIAGNOSIS — Z0000 Encounter for general adult medical examination without abnormal findings: Secondary | ICD-10-CM | POA: Diagnosis not present

## 2024-03-06 DIAGNOSIS — R030 Elevated blood-pressure reading, without diagnosis of hypertension: Secondary | ICD-10-CM | POA: Diagnosis not present

## 2024-03-14 DIAGNOSIS — G8929 Other chronic pain: Secondary | ICD-10-CM | POA: Diagnosis not present

## 2024-03-14 DIAGNOSIS — M25551 Pain in right hip: Secondary | ICD-10-CM | POA: Diagnosis not present

## 2024-03-14 DIAGNOSIS — M545 Low back pain, unspecified: Secondary | ICD-10-CM | POA: Diagnosis not present

## 2024-03-14 DIAGNOSIS — M25552 Pain in left hip: Secondary | ICD-10-CM | POA: Diagnosis not present

## 2024-03-24 DIAGNOSIS — M47816 Spondylosis without myelopathy or radiculopathy, lumbar region: Secondary | ICD-10-CM | POA: Diagnosis not present

## 2024-03-24 DIAGNOSIS — G8929 Other chronic pain: Secondary | ICD-10-CM | POA: Diagnosis not present

## 2024-03-24 DIAGNOSIS — M25551 Pain in right hip: Secondary | ICD-10-CM | POA: Diagnosis not present

## 2024-04-01 DIAGNOSIS — G8929 Other chronic pain: Secondary | ICD-10-CM | POA: Diagnosis not present

## 2024-04-01 DIAGNOSIS — M25551 Pain in right hip: Secondary | ICD-10-CM | POA: Diagnosis not present

## 2024-04-08 DIAGNOSIS — M25552 Pain in left hip: Secondary | ICD-10-CM | POA: Diagnosis not present

## 2024-04-09 ENCOUNTER — Encounter: Payer: Self-pay | Admitting: Obstetrics and Gynecology

## 2024-04-09 ENCOUNTER — Ambulatory Visit: Admitting: Obstetrics and Gynecology

## 2024-04-09 ENCOUNTER — Other Ambulatory Visit (HOSPITAL_COMMUNITY)
Admission: RE | Admit: 2024-04-09 | Discharge: 2024-04-09 | Disposition: A | Discharge status: Home / Self Care | Source: Ambulatory Visit | Attending: Physician Assistant | Admitting: Physician Assistant

## 2024-04-09 VITALS — BP 130/85 | HR 83 | Ht 62.0 in | Wt 131.8 lb

## 2024-04-09 DIAGNOSIS — N949 Unspecified condition associated with female genital organs and menstrual cycle: Secondary | ICD-10-CM

## 2024-04-09 DIAGNOSIS — Z0000 Encounter for general adult medical examination without abnormal findings: Secondary | ICD-10-CM | POA: Diagnosis present

## 2024-04-09 DIAGNOSIS — R232 Flushing: Secondary | ICD-10-CM | POA: Diagnosis not present

## 2024-04-09 DIAGNOSIS — Z01419 Encounter for gynecological examination (general) (routine) without abnormal findings: Secondary | ICD-10-CM

## 2024-04-09 DIAGNOSIS — Z1331 Encounter for screening for depression: Secondary | ICD-10-CM | POA: Diagnosis not present

## 2024-04-09 DIAGNOSIS — L989 Disorder of the skin and subcutaneous tissue, unspecified: Secondary | ICD-10-CM | POA: Diagnosis not present

## 2024-04-09 MED ORDER — DOXYCYCLINE HYCLATE 100 MG PO CAPS
100.0000 mg | ORAL_CAPSULE | Freq: Two times a day (BID) | ORAL | 0 refills | Status: AC
Start: 2024-04-09 — End: 2024-04-23

## 2024-04-09 MED ORDER — CLINDAMYCIN PHOSPHATE 1 % EX SOLN: Freq: Two times a day (BID) | CUTANEOUS | 1 refills | Status: AC

## 2024-04-09 NOTE — Progress Notes (Signed)
 ANNUAL EXAM Patient name: Holly Ingram MRN [id number]  Date of birth: 1976-06-24 Chief Complaint:   No chief complaint on file.  History of Present Illness:   Holly Ingram is a 48 y.o. G23P0020  female being seen today for a routine annual exam.  Current complaints: establishing care 2023 ct abdomen pelvis revealed cyst on ovary 4.1cm  Last menstrual period was 2 years ago  Has not had a pap in 20 years Has not had mammogram yet  Reports 20 years experiencing cysts/ boild acne like lesions in her vaginal area, groin, buttocks, arm pit, between breast. Years ago she had tried creams, abx, gels. Has an upcoming appointment with dermatology  Endorses hot flash and mood change     Gynecologic History Patient's last menstrual period was 2 years ago  Contraception: postmenopause Last Pap: 20 years ago, unsure  Last mammogram:n/a     04/09/2024    2:08 PM  Depression screen PHQ 2/9  Decreased Interest 1  Down, Depressed, Hopeless 1  PHQ - 2 Score 2  Altered sleeping 3  Tired, decreased energy 3  Change in appetite 2  Feeling bad or failure about yourself  1  Trouble concentrating 0  Moving slowly or fidgety/restless 2  Suicidal thoughts 1  PHQ-9 Score 14        04/09/2024    2:10 PM  GAD 7 : Generalized Anxiety Score  Nervous, Anxious, on Edge 2  Control/stop worrying 1  Worry too much - different things 1  Trouble relaxing 1  Restless 1  Easily annoyed or irritable 3  Afraid - awful might happen 3  Total GAD 7 Score 12     Review of Systems:   Pertinent items are noted in HPI Denies any headaches, blurred vision, fatigue, shortness of breath, chest pain, abdominal pain, abnormal vaginal discharge/itching/odor/irritation, problems with periods, bowel movements, urination, or intercourse unless otherwise stated above. Pertinent History Reviewed:  Reviewed past medical,surgical, social and family history.  Reviewed problem list, medications and  allergies. Physical Assessment:   Vitals:   04/09/24 1312  BP: 130/85  Pulse: 83  Weight: 131 lb 12.8 oz (59.8 kg)  Height: 5' 2 (1.575 m)  Body mass index is 24.11 kg/m.        Physical Examination:   General appearance - well appearing, and in no distress  Mental status - alert, oriented   Psych:  She has a normal mood and affect  Skin - warm and dry, normal color  Chest - effort normal, all lung fields clear to auscultation bilaterally  Heart - normal rate and regular rhythm  Neck:  midline trachea, no thyromegaly or nodules  Breasts - breasts appear normal, no suspicious masses, no skin or nipple changes or  axillary nodes  Abdomen - soft, nontender, nondistended  Pelvic - VULVA: normal appearing vulva with no masses, tenderness or lesions  VAGINA: normal appearing vagina with normal color and discharge, no lesions  CERVIX: normal appearing cervix without discharge or lesions, no CMT  Thin prep pap is done w HR HPV cotesting  UTERUS: uterus is felt to be normal size, shape, consistency and nontender   ADNEXA: No adnexal masses or tenderness noted.  Extremities:  No swelling or varicosities noted  Chaperone present for exam  No results found for this or any previous visit (from the past 24 hours).  Assessment & Plan:  1. Encounter for annual routine gynecological examination (Primary) Pap today, discussed guidelines Encouraged schedule mammogram   -  Cytology - PAP( [place]) - Cervicovaginal ancillary only( [place])  2. History of adnexal cyst Occasional pain, desires follow up u/s  - US  PELVIC COMPLETE WITH TRANSVAGINAL; Future  3. Skin lesion Trial oral and topical, is seeing derm next visit  - doxycycline  (VIBRAMYCIN ) 100 MG capsule; Take 1 capsule (100 mg total) by mouth 2 (two) times daily for 14 days.  Dispense: 28 capsule; Refill: 0 - clindamycin  (CLEOCIN  T) 1 % external solution; Apply topically 2 (two) times daily.  Dispense: 30 mL; Refill: 1  4.  Positive depression screening Referral placed  - Ambulatory referral to Integrated Behavioral [facility] 5. Hot flashes Mild/mod in nature, discussed options  such as veozah, SSRI, would look into MHT. She is considering  med to help with hot flash and mood change.    Labs/procedures today:   Mammogram: schedule screening mammo as soon as possible, or sooner if problems   Orders Placed This Encounter  Procedures   US  PELVIC COMPLETE WITH TRANSVAGINAL   Ambulatory referral to Psychiatry    Meds:  Meds ordered this encounter  Medications   doxycycline  (VIBRAMYCIN ) 100 MG capsule    Sig: Take 1 capsule (100 mg total) by mouth 2 (two) times daily for 14 days.    Dispense:  28 capsule    Refill:  0   clindamycin  (CLEOCIN  T) 1 % external solution    Sig: Apply topically 2 (two) times daily.    Dispense:  30 mL    Refill:  1    Follow-up: Return in about 1 year (around 04/09/2025) for [place] Holly Delores, FNP

## 2024-04-09 NOTE — Progress Notes (Signed)
 Pt has a cyst on her right ovary.   Pt states she hasn't had a period in 2 years. Pt states last PAP was over 20 years ago. Pt declines breast exam. Will do PAP today. Declines STD testing.   Pt experiences acne like sores between her breast, vaginal area including panty line, and buttocks. Has appt with dermatologists next week.   Steroid injections given in hips and back due to pain.   Pt scored 14 on PHQ and 12 on GAD. Referral made; FNP aware.

## 2024-04-10 LAB — CERVICOVAGINAL ANCILLARY ONLY
Bacterial Vaginitis (gardnerella): NEGATIVE
Candida Glabrata: NEGATIVE
Candida Vaginitis: NEGATIVE
Chlamydia: NEGATIVE
Comment: NEGATIVE
Comment: NEGATIVE
Comment: NEGATIVE
Comment: NEGATIVE
Comment: NEGATIVE
Comment: NORMAL
Neisseria Gonorrhea: NEGATIVE
Trichomonas: POSITIVE — AB

## 2024-04-11 ENCOUNTER — Ambulatory Visit: Payer: Self-pay | Admitting: Obstetrics and Gynecology

## 2024-04-11 MED ORDER — METRONIDAZOLE 500 MG PO TABS
500.0000 mg | ORAL_TABLET | Freq: Two times a day (BID) | ORAL | 0 refills | Status: AC
Start: 2024-04-11 — End: 2024-04-18

## 2024-04-14 LAB — CYTOLOGY - PAP
Comment: NEGATIVE
Diagnosis: NEGATIVE
High risk HPV: NEGATIVE

## 2024-04-18 ENCOUNTER — Other Ambulatory Visit: Payer: Self-pay | Admitting: Obstetrics and Gynecology

## 2024-04-18 DIAGNOSIS — R232 Flushing: Secondary | ICD-10-CM

## 2024-04-18 MED ORDER — PAROXETINE MESYLATE 7.5 MG PO CAPS: 7.5000 mg | ORAL_CAPSULE | Freq: Every day | ORAL | 6 refills | Status: AC

## 2024-04-20 DIAGNOSIS — Z79899 Other long term (current) drug therapy: Secondary | ICD-10-CM | POA: Diagnosis not present

## 2024-04-23 ENCOUNTER — Ambulatory Visit (HOSPITAL_COMMUNITY)

## 2024-04-23 ENCOUNTER — Encounter (HOSPITAL_COMMUNITY): Payer: Self-pay

## 2024-04-24 DIAGNOSIS — Z79899 Other long term (current) drug therapy: Secondary | ICD-10-CM | POA: Diagnosis not present

## 2024-04-25 DIAGNOSIS — M25551 Pain in right hip: Secondary | ICD-10-CM | POA: Diagnosis not present

## 2024-04-25 DIAGNOSIS — M25552 Pain in left hip: Secondary | ICD-10-CM | POA: Diagnosis not present

## 2024-04-25 DIAGNOSIS — M545 Low back pain, unspecified: Secondary | ICD-10-CM | POA: Diagnosis not present

## 2024-04-25 DIAGNOSIS — M5430 Sciatica, unspecified side: Secondary | ICD-10-CM | POA: Diagnosis not present

## 2024-04-25 DIAGNOSIS — Z79899 Other long term (current) drug therapy: Secondary | ICD-10-CM | POA: Diagnosis not present

## 2024-04-29 ENCOUNTER — Ambulatory Visit (HOSPITAL_COMMUNITY)
Admission: RE | Admit: 2024-04-29 | Discharge: 2024-04-29 | Disposition: A | Discharge status: Home / Self Care | Source: Ambulatory Visit | Attending: Obstetrics and Gynecology | Admitting: Obstetrics and Gynecology

## 2024-04-29 DIAGNOSIS — N8301 Follicular cyst of right ovary: Secondary | ICD-10-CM | POA: Diagnosis not present

## 2024-04-29 DIAGNOSIS — N949 Unspecified condition associated with female genital organs and menstrual cycle: Secondary | ICD-10-CM | POA: Diagnosis present

## 2024-04-29 DIAGNOSIS — Z79899 Other long term (current) drug therapy: Secondary | ICD-10-CM | POA: Diagnosis not present

## 2024-05-01 ENCOUNTER — Other Ambulatory Visit: Payer: Self-pay | Admitting: Obstetrics and Gynecology

## 2024-05-01 ENCOUNTER — Other Ambulatory Visit: Payer: Self-pay

## 2024-05-01 DIAGNOSIS — N393 Stress incontinence (female) (male): Secondary | ICD-10-CM

## 2024-05-01 DIAGNOSIS — N3941 Urge incontinence: Secondary | ICD-10-CM

## 2024-05-14 ENCOUNTER — Ambulatory Visit

## 2024-05-20 DIAGNOSIS — Z79899 Other long term (current) drug therapy: Secondary | ICD-10-CM | POA: Diagnosis not present

## 2024-05-25 DIAGNOSIS — Z79899 Other long term (current) drug therapy: Secondary | ICD-10-CM | POA: Diagnosis not present

## 2024-06-19 DIAGNOSIS — R21 Rash and other nonspecific skin eruption: Secondary | ICD-10-CM | POA: Diagnosis not present

## 2024-06-19 DIAGNOSIS — L309 Dermatitis, unspecified: Secondary | ICD-10-CM | POA: Diagnosis not present

## 2024-06-19 DIAGNOSIS — Z79899 Other long term (current) drug therapy: Secondary | ICD-10-CM | POA: Diagnosis not present

## 2024-06-19 NOTE — Progress Notes (Signed)
 Subjective Patient ID: Holly Ingram is a 48 y.o. female.    Is a 48 year old female who presents with clinic for evaluation of rash mostly around the neck and back that has been going on since past 8 to 10 days.  The rashes has been itchy in nature.  Initially she said that she started using a new Dove body wash which she thought might have caused it however she changed back to the original one without any significant relief.  Denies any rash in the webspaces.  Denies any fevers or chills.  Has been using nystatin without any relief.  Denies any fevers, chills or other complaints per denies any chest pain, shortness of breath, cough.  Patient has an appointment with dermatologist coming Wednesday.   Rash Pertinent negatives include no congestion, cough, diarrhea, fatigue, fever, shortness of breath, sore throat or vomiting.    Review of Systems  Constitutional:  Negative for chills, fatigue and fever.  HENT:  Negative for congestion, ear pain, postnasal drip, sinus pain, sneezing, sore throat and trouble swallowing.   Eyes:  Negative for discharge and redness.  Respiratory:  Negative for cough, shortness of breath and wheezing.   Cardiovascular:  Negative for chest pain and leg swelling.  Gastrointestinal:  Negative for abdominal pain, diarrhea, nausea and vomiting.  Genitourinary:  Negative for dysuria, flank pain, frequency, hematuria and vaginal discharge.  Musculoskeletal:  Negative for arthralgias, joint swelling and myalgias.  Skin:  Positive for rash.  Allergic/Immunologic: Negative for environmental allergies.  Neurological:  Negative for dizziness, weakness, numbness and headaches.  Hematological:  Negative for adenopathy.  Psychiatric/Behavioral:  Negative for confusion and sleep disturbance. The patient is not nervous/anxious.     Patient History  Allergies: Allergies  Allergen Reactions  . Morphine Hives    History reviewed. No pertinent past medical history. History  reviewed. No pertinent surgical history. Social History   Socioeconomic History  . Marital status: Significant Other    Spouse name: Not on file  . Number of children: Not on file  . Years of education: Not on file  . Highest education level: Not on file  Occupational History  . Not on file  Tobacco Use  . Smoking status: Never  . Smokeless tobacco: Never  Substance and Sexual Activity  . Alcohol use: Not on file  . Drug use: Not on file  . Sexual activity: Not on file  Other Topics Concern  . Not on file  Social History Narrative  . Not on file   History reviewed. No pertinent family history. Current Outpatient Medications on File Prior to Visit  Medication Sig Dispense Refill  . albuterol  108 (90 Base) MCG/ACT inhaler Inhale 2 puffs every 6 (six) hours if needed for wheezing. 18 g 0  . amphetamine-dextroamphetamine (Adderall) 20 MG tablet Take 1 tablet by mouth 1 (one) time each day.    SABRA azithromycin (Zithromax Z-Pak) 250 MG tablet Take 2 tab po on day one, then one tablet po every day x4 days 6 tablet 0  . benzonatate (Tessalon Perles) 100 MG capsule T 1-2 tab po tid prn 20 capsule 0  . ipratropium (Atrovent) 0.06 % nasal spray Administer 2 sprays into each nostril in the morning and 2 sprays at noon and 2 sprays in the evening and 2 sprays before bedtime. 15 mL 0   No current facility-administered medications on file prior to visit.    Objective  Vitals:   06/19/24 1853  BP: (!) 148/88  BP Location: Right  arm  Pulse: 93  Resp: 18  Temp: 36.7 C (98.1 F)  TempSrc: Oral  SpO2: 98%  Height: 5' 3  PainSc: 0-No pain  LMP date is Unknown     OBGYN/Pregnancy Status: Premenopausal      No results found.  Physical Exam Vitals and nursing note reviewed.  Constitutional:      Appearance: Normal appearance. She is not ill-appearing.  HENT:     Head: Normocephalic and atraumatic.     Right Ear: Tympanic membrane normal. There is no impacted cerumen.     Left  Ear: Tympanic membrane normal. There is no impacted cerumen.     Nose: No congestion or rhinorrhea.     Mouth/Throat:     Mouth: Mucous membranes are moist.     Pharynx: No oropharyngeal exudate or posterior oropharyngeal erythema.  Eyes:     Extraocular Movements: Extraocular movements intact.  Cardiovascular:     Rate and Rhythm: Normal rate and regular rhythm.  Pulmonary:     Effort: Pulmonary effort is normal.     Breath sounds: Normal breath sounds.  Abdominal:     General: Bowel sounds are normal. There is no distension.     Palpations: Abdomen is soft. There is no mass.     Tenderness: There is no abdominal tenderness.  Musculoskeletal:        General: Normal range of motion.     Cervical back: Normal range of motion and neck supple. No tenderness.     Right lower leg: No edema.     Left lower leg: No edema.  Lymphadenopathy:     Cervical: No cervical adenopathy.  Skin:    General: Skin is warm and dry.     Comments: Itchy rash noted around the neck, lower back.  No concern with cellulitis.  Concern for dermatitis.  Small scabs noted on the scalp as well.  No rashes or lesions noted in the webspaces.  No lesions noted.  Neurological:     General: No focal deficit present.     Mental Status: She is alert and oriented to person, place, and time.  Psychiatric:        Mood and Affect: Mood normal.     No results found for this visit on 06/19/24.     Procedures MDM:     1 Acute, uncomplicated illness or injury     Explanation of Medical Decision Making and variances from expected care:  Including but not limited to dermatitis, contact dermatitis, insect bites, cellulitis, skin, scabies  H&P as above.  Vital signs are stable.  No acute distress.  Examination as above.  Noncellulitic.  Patient has been using nystatin without any relief.  Patient was prescribed prednisone and Zyrtec for the itchiness.  Advised over-the-counter itchiness cream as well.  Patient has  appointment with dermatologist coming Wednesday.  Advised outpatient follow-up with dermatology and PCP as well.  Advised to stop the prednisone if symptoms worsen and follow-up outpatient.   Assessment requiring historian other than patient: No     Independent visualization of image, tracing, or test: No     Discussion of management with another provider: No     Risk:: Low            Assessment/Plan Diagnoses and all orders for this visit:  Dermatitis  Rash  Other orders -     predniSONE (Deltasone) 20 MG tablet; Take 1 tablet (20 mg total) by mouth 1 (one) time each day for 3 days. -  cetirizine (ZyrTEC) 10 MG tablet; Take 1 tablet (10 mg total) by mouth 1 (one) time each day.     Disposition Status: Home  Patient Instructions  Please follow-up with your primary care doctor and dermatologist as scheduled.  Take the prednisone as prescribed.  If the symptoms gets worse please stop.  Take over-the-counter medication include Benadryl, cetirizine, symptomatic relief.  Progress note signed by Gordy Queen, PA on 06/19/24 at  7:26 PM

## 2024-06-19 NOTE — Progress Notes (Signed)
 Rash on neck for 2 months  Rash on abdomen and back for 9 days

## 2024-06-19 NOTE — Progress Notes (Signed)
 Holly Ingram is a 48 y.o. female Presents to the clinic with

## 2024-06-23 DIAGNOSIS — Z79899 Other long term (current) drug therapy: Secondary | ICD-10-CM | POA: Diagnosis not present

## 2024-06-24 ENCOUNTER — Ambulatory Visit: Admitting: Obstetrics and Gynecology

## 2024-06-24 DIAGNOSIS — L732 Hidradenitis suppurativa: Secondary | ICD-10-CM | POA: Diagnosis not present

## 2024-06-24 DIAGNOSIS — L218 Other seborrheic dermatitis: Secondary | ICD-10-CM | POA: Diagnosis not present

## 2024-06-24 DIAGNOSIS — D2261 Melanocytic nevi of right upper limb, including shoulder: Secondary | ICD-10-CM | POA: Diagnosis not present

## 2024-06-24 DIAGNOSIS — D2262 Melanocytic nevi of left upper limb, including shoulder: Secondary | ICD-10-CM | POA: Diagnosis not present

## 2024-06-24 DIAGNOSIS — D485 Neoplasm of uncertain behavior of skin: Secondary | ICD-10-CM | POA: Diagnosis not present

## 2024-06-24 DIAGNOSIS — D225 Melanocytic nevi of trunk: Secondary | ICD-10-CM | POA: Diagnosis not present

## 2024-06-24 DIAGNOSIS — D2272 Melanocytic nevi of left lower limb, including hip: Secondary | ICD-10-CM | POA: Diagnosis not present

## 2024-06-24 DIAGNOSIS — D2271 Melanocytic nevi of right lower limb, including hip: Secondary | ICD-10-CM | POA: Diagnosis not present
